# Patient Record
Sex: Male | Born: 1960
Health system: Southern US, Community
[De-identification: ages and names within clinical notes are randomized; demographics above are authoritative.]

## PROBLEM LIST (undated history)

## (undated) DIAGNOSIS — I519 Heart disease, unspecified: Secondary | ICD-10-CM

## (undated) DIAGNOSIS — E785 Hyperlipidemia, unspecified: Secondary | ICD-10-CM

## (undated) DIAGNOSIS — I5189 Other ill-defined heart diseases: Secondary | ICD-10-CM

## (undated) HISTORY — PX: DENTAL SURGERY: SHX609

## (undated) HISTORY — DX: Hyperlipidemia, unspecified: E78.5

## (undated) HISTORY — DX: Other ill-defined heart diseases: I51.89

---
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
## 2004-01-25 ENCOUNTER — Emergency Department (HOSPITAL_COMMUNITY): Admission: EM | Admit: 2004-01-25 | Discharge: 2004-01-25 | Payer: Self-pay | Admitting: Emergency Medicine

## 2006-04-17 ENCOUNTER — Emergency Department (HOSPITAL_COMMUNITY): Admission: EM | Admit: 2006-04-17 | Discharge: 2006-04-17 | Payer: Self-pay | Admitting: *Deleted

## 2006-05-27 ENCOUNTER — Emergency Department (HOSPITAL_COMMUNITY): Admission: EM | Admit: 2006-05-27 | Discharge: 2006-05-27 | Payer: Self-pay | Admitting: Emergency Medicine

## 2009-09-20 ENCOUNTER — Emergency Department (HOSPITAL_COMMUNITY): Admission: EM | Admit: 2009-09-20 | Discharge: 2009-09-20 | Payer: Self-pay | Admitting: Emergency Medicine

## 2010-11-09 LAB — CBC
HCT: 42.9 % (ref 39.0–52.0)
Hemoglobin: 14 g/dL (ref 13.0–17.0)
MCHC: 32.7 g/dL (ref 30.0–36.0)
MCV: 98.8 fL (ref 78.0–100.0)
Platelets: 149 K/uL — ABNORMAL LOW (ref 150–400)
RBC: 4.34 MIL/uL (ref 4.22–5.81)
RDW: 13.9 % (ref 11.5–15.5)
WBC: 8.4 K/uL (ref 4.0–10.5)

## 2010-11-09 LAB — DIFFERENTIAL
Basophils Relative: 1 % (ref 0–1)
Eosinophils Relative: 2 % (ref 0–5)
Lymphocytes Relative: 32 % (ref 12–46)
Monocytes Absolute: 0.6 10*3/uL (ref 0.1–1.0)
Neutrophils Relative %: 58 % (ref 43–77)

## 2010-11-09 LAB — HEMOCCULT GUIAC POC 1CARD (OFFICE): Fecal Occult Bld: POSITIVE

## 2016-07-11 ENCOUNTER — Emergency Department (HOSPITAL_COMMUNITY)
Admission: EM | Admit: 2016-07-11 | Discharge: 2016-07-11 | Disposition: A | Discharge status: Home / Self Care | Payer: Self-pay | Attending: Emergency Medicine | Admitting: Emergency Medicine

## 2016-07-11 ENCOUNTER — Emergency Department (HOSPITAL_COMMUNITY): Payer: Self-pay

## 2016-07-11 ENCOUNTER — Encounter (HOSPITAL_COMMUNITY): Payer: Self-pay

## 2016-07-11 DIAGNOSIS — Z791 Long term (current) use of non-steroidal anti-inflammatories (NSAID): Secondary | ICD-10-CM | POA: Insufficient documentation

## 2016-07-11 DIAGNOSIS — F17200 Nicotine dependence, unspecified, uncomplicated: Secondary | ICD-10-CM | POA: Insufficient documentation

## 2016-07-11 DIAGNOSIS — R059 Cough, unspecified: Secondary | ICD-10-CM

## 2016-07-11 DIAGNOSIS — J40 Bronchitis, not specified as acute or chronic: Secondary | ICD-10-CM | POA: Insufficient documentation

## 2016-07-11 DIAGNOSIS — R05 Cough: Secondary | ICD-10-CM

## 2016-07-11 MED ORDER — PREDNISONE 50 MG PO TABS: ORAL_TABLET | ORAL | 0 refills | Status: DC

## 2016-07-11 MED ORDER — ALBUTEROL SULFATE HFA 108 (90 BASE) MCG/ACT IN AERS: 2.0000 | INHALATION_SPRAY | Freq: Four times a day (QID) | RESPIRATORY_TRACT | 2 refills | Status: DC | PRN

## 2016-07-11 NOTE — ED Triage Notes (Signed)
Pt presents with c/o cough and body aches for the past couple of weeks. Pt reports the symptoms are worse at night when he is laying down. Pt denies any vomiting or diarrhea.

## 2016-07-11 NOTE — ED Provider Notes (Signed)
[facility]-EMERGENCY DEPT Provider Note   CSN: [id number] Arrival [id number] & time: 07/11/16  0931     History   Chief Complaint Chief Complaint  Patient presents with  . Cough  . Generalized Body Aches    HPI Nicholas Espinoza is a 55 y.o. male.  55 year old male presents with two-week history of nonoperative cough is worse in the evening. States he does use tobacco products daily. No fever or chills. No nausea vomiting. No recent medication changes. Denies any CHF or anginal type symptoms. Cough is worse when he tries to smoke cigarettes also. Has been using over-the-counter medications without relief. No prior history of same. Denies any history of COPD      History reviewed. No pertinent past medical history.  There are no active problems to display for this patient.   History reviewed. No pertinent surgical history.     Home Medications    Prior to Admission medications   Not on File    Family History No family history on file.  Social History Social History  Substance Use Topics  . Smoking status: Current Every Day Smoker  . Smokeless tobacco: Never Used  . Alcohol use No     Allergies   Patient has no known allergies.   Review of Systems Review of Systems  All other systems reviewed and are negative.    Physical Exam Updated Vital Signs BP 120/86 (BP Location: Left Arm)   Pulse 97   Temp 98 F (36.7 C) (Oral)   Resp 18   SpO2 100%   Physical Exam  Constitutional: He is oriented to person, place, and time. He appears well-developed and well-nourished.  Non-toxic appearance. No distress.  HENT:  Head: Normocephalic and atraumatic.  Eyes: Conjunctivae, EOM and lids are normal. Pupils are equal, round, and reactive to light.  Neck: Normal range of motion. Neck supple. No tracheal deviation present. No thyroid mass present.  Cardiovascular: Normal rate, regular rhythm and normal heart sounds.  Exam reveals no gallop.   No murmur  heard. Pulmonary/Chest: Effort normal. No stridor. No respiratory distress. He has decreased breath sounds. He has no wheezes. He has no rhonchi. He has no rales.  Abdominal: Soft. Normal appearance and bowel sounds are normal. He exhibits no distension. There is no tenderness. There is no rebound and no CVA tenderness.  Musculoskeletal: Normal range of motion. He exhibits no edema or tenderness.  Neurological: He is alert and oriented to person, place, and time. He has normal strength. No cranial nerve deficit or sensory deficit. GCS eye subscore is 4. GCS verbal subscore is 5. GCS motor subscore is 6.  Skin: Skin is warm and dry. No abrasion and no rash noted.  Psychiatric: He has a normal mood and affect. His speech is normal and behavior is normal.  Nursing note and vitals reviewed.    ED Treatments / Results  Labs (all labs ordered are listed, but only abnormal results are displayed) Labs Reviewed - No data to display  EKG  EKG Interpretation None       Radiology No results found.  Procedures Procedures (including critical care time)  Medications Ordered in ED Medications - No data to display   Initial Impression / Assessment and Plan / ED Course  I have reviewed the triage vital signs and the nursing notes.  Pertinent labs & imaging results that were available during my care of the patient were reviewed by me and considered in my medical decision making (see chart for  details).  Clinical Course     Chest x-ray without acute findings. Suspect that he has bronchitis. Will treat with steroids and albuterol inhaler  Final Clinical Impressions(s) / ED Diagnoses   Final diagnoses:  None    New Prescriptions New Prescriptions   No medications on file     Lorre NickAnthony Lanina Larranaga, MD 07/11/16 1115

## 2016-07-11 NOTE — ED Notes (Signed)
patietn returned from xray

## 2017-09-23 ENCOUNTER — Emergency Department (HOSPITAL_COMMUNITY): Payer: Self-pay

## 2017-09-23 ENCOUNTER — Emergency Department (HOSPITAL_COMMUNITY)
Admission: EM | Admit: 2017-09-23 | Discharge: 2017-09-23 | Disposition: A | Discharge status: Home / Self Care | Payer: Self-pay | Attending: Emergency Medicine | Admitting: Emergency Medicine

## 2017-09-23 ENCOUNTER — Other Ambulatory Visit: Payer: Self-pay

## 2017-09-23 ENCOUNTER — Encounter (HOSPITAL_COMMUNITY): Payer: Self-pay | Admitting: Emergency Medicine

## 2017-09-23 DIAGNOSIS — Z79899 Other long term (current) drug therapy: Secondary | ICD-10-CM | POA: Insufficient documentation

## 2017-09-23 DIAGNOSIS — F17200 Nicotine dependence, unspecified, uncomplicated: Secondary | ICD-10-CM | POA: Insufficient documentation

## 2017-09-23 DIAGNOSIS — M545 Low back pain, unspecified: Secondary | ICD-10-CM

## 2017-09-23 DIAGNOSIS — M25512 Pain in left shoulder: Secondary | ICD-10-CM | POA: Insufficient documentation

## 2017-09-23 MED ORDER — IBUPROFEN 400 MG PO TABS
600.0000 mg | ORAL_TABLET | Freq: Once | ORAL | Status: AC
  Administered 2017-09-23: 600 mg via ORAL
  Filled 2017-09-23: qty 1

## 2017-09-23 MED ORDER — IBUPROFEN 600 MG PO TABS: 600.0000 mg | ORAL_TABLET | Freq: Four times a day (QID) | ORAL | 0 refills | Status: DC | PRN

## 2017-09-23 MED ORDER — METHOCARBAMOL 500 MG PO TABS: 500.0000 mg | ORAL_TABLET | Freq: Every evening | ORAL | 0 refills | Status: DC | PRN

## 2017-09-23 NOTE — ED Notes (Signed)
Declined W/C at D/C and was escorted to lobby by RN. 
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
## 2017-09-23 NOTE — ED Notes (Signed)
Patient transported to X-ray 

## 2017-09-23 NOTE — Discharge Instructions (Signed)
Take Ibuprofen three times daily for the next week. Take this medicine with food. °Take muscle relaxer at bedtime to help you sleep. This medicine makes you drowsy so do not take before driving or work °Use a heating pad for sore muscles - use for 20 minutes several times a day °Return for worsening symptoms ° °
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
## 2020-01-15 ENCOUNTER — Encounter (HOSPITAL_COMMUNITY): Payer: Self-pay | Admitting: Emergency Medicine

## 2020-01-15 ENCOUNTER — Emergency Department (HOSPITAL_COMMUNITY): Payer: Self-pay

## 2020-01-15 ENCOUNTER — Emergency Department (HOSPITAL_COMMUNITY)
Admission: EM | Admit: 2020-01-15 | Discharge: 2020-01-15 | Disposition: A | Discharge status: Home / Self Care | Payer: Self-pay | Attending: Emergency Medicine | Admitting: Emergency Medicine

## 2020-01-15 ENCOUNTER — Other Ambulatory Visit: Payer: Self-pay

## 2020-01-15 DIAGNOSIS — R0602 Shortness of breath: Secondary | ICD-10-CM

## 2020-01-15 DIAGNOSIS — Z20822 Contact with and (suspected) exposure to covid-19: Secondary | ICD-10-CM | POA: Insufficient documentation

## 2020-01-15 DIAGNOSIS — R059 Cough, unspecified: Secondary | ICD-10-CM

## 2020-01-15 DIAGNOSIS — M7918 Myalgia, other site: Secondary | ICD-10-CM | POA: Insufficient documentation

## 2020-01-15 DIAGNOSIS — R0981 Nasal congestion: Secondary | ICD-10-CM | POA: Insufficient documentation

## 2020-01-15 DIAGNOSIS — R05 Cough: Secondary | ICD-10-CM | POA: Insufficient documentation

## 2020-01-15 DIAGNOSIS — R52 Pain, unspecified: Secondary | ICD-10-CM

## 2020-01-15 DIAGNOSIS — F17210 Nicotine dependence, cigarettes, uncomplicated: Secondary | ICD-10-CM | POA: Insufficient documentation

## 2020-01-15 LAB — TROPONIN I (HIGH SENSITIVITY)
Troponin I (High Sensitivity): 3 ng/L (ref <18)
Troponin I (High Sensitivity): 3 ng/L (ref <18)

## 2020-01-15 LAB — CBC
HCT: 37.6 % — ABNORMAL LOW (ref 39.0–52.0)
Hemoglobin: 12.9 g/dL — ABNORMAL LOW (ref 13.0–17.0)
MCH: 32.8 pg (ref 26.0–34.0)
MCHC: 34.3 g/dL (ref 30.0–36.0)
MCV: 95.7 fL (ref 80.0–100.0)
Platelets: 140 10*3/uL — ABNORMAL LOW (ref 150–400)
RBC: 3.93 MIL/uL — ABNORMAL LOW (ref 4.22–5.81)
RDW: 13.2 % (ref 11.5–15.5)
WBC: 12.2 10*3/uL — ABNORMAL HIGH (ref 4.0–10.5)
nRBC: 0 % (ref 0.0–0.2)

## 2020-01-15 LAB — BASIC METABOLIC PANEL
Anion gap: 11 (ref 5–15)
BUN: 16 mg/dL (ref 6–20)
CO2: 20 mmol/L — ABNORMAL LOW (ref 22–32)
Calcium: 8.7 mg/dL — ABNORMAL LOW (ref 8.9–10.3)
Chloride: 104 mmol/L (ref 98–111)
Creatinine, Ser: 0.89 mg/dL (ref 0.61–1.24)
GFR calc Af Amer: >60 mL/min (ref >60)
GFR calc non Af Amer: >60 mL/min (ref >60)
Glucose, Bld: 127 mg/dL — ABNORMAL HIGH (ref 70–99)
Potassium: 3.4 mmol/L — ABNORMAL LOW (ref 3.5–5.1)
Sodium: 135 mmol/L (ref 135–145)

## 2020-01-15 LAB — BRAIN NATRIURETIC PEPTIDE: B Natriuretic Peptide: 35.3 pg/mL (ref 0.0–100.0)

## 2020-01-15 LAB — SARS CORONAVIRUS 2 BY RT PCR (HOSPITAL ORDER, PERFORMED IN ~~LOC~~ HOSPITAL LAB): SARS Coronavirus 2: NEGATIVE

## 2020-01-15 MED ORDER — BENZONATATE 100 MG PO CAPS
100.0000 mg | ORAL_CAPSULE | Freq: Three times a day (TID) | ORAL | 0 refills | Status: DC
Start: 2020-01-15 — End: 2020-02-28

## 2020-01-15 MED ORDER — POTASSIUM CHLORIDE CRYS ER 20 MEQ PO TBCR
20.0000 meq | EXTENDED_RELEASE_TABLET | Freq: Two times a day (BID) | ORAL | 0 refills | Status: DC
Start: 2020-01-15 — End: 2020-02-28

## 2020-01-15 MED ORDER — SODIUM CHLORIDE 0.9% FLUSH: 3.0000 mL | Freq: Once | INTRAVENOUS | Status: DC

## 2020-01-15 MED ORDER — FLUTICASONE PROPIONATE 50 MCG/ACT NA SUSP
2.0000 | Freq: Every day | NASAL | 2 refills | Status: DC
Start: 2020-01-15 — End: 2020-02-28

## 2020-01-15 MED ORDER — PREDNISONE 10 MG (21) PO TBPK
ORAL_TABLET | ORAL | 0 refills | Status: DC
Start: 2020-01-15 — End: 2020-01-24

## 2020-01-15 MED ORDER — POTASSIUM CHLORIDE CRYS ER 20 MEQ PO TBCR
20.0000 meq | EXTENDED_RELEASE_TABLET | Freq: Once | ORAL | Status: AC
  Administered 2020-01-15: 20 meq via ORAL
  Filled 2020-01-15: qty 1

## 2020-01-15 NOTE — ED Triage Notes (Signed)
Pt in w/sob, chills and malaise since Saturday morning. States no known sick contacts. Feels congested, says his legs are sore, and he just feels generalized weakness.

## 2020-01-15 NOTE — ED Provider Notes (Signed)
[facility] EMERGENCY DEPARTMENT Provider Note   CSN: [id number] Arrival date & time: 01/15/20  3419     History Chief Complaint  Patient presents with  . Shortness of Breath    Nicholas Espinoza is a 59 y.o. male.  HPI      Nicholas Espinoza is a 59 y.o. male, patient with no diagnosed past medical history, presenting to the ED with shortness of breath beginning about two days ago. Accompanied by intermittently productive cough, nasal congestion, sinus pressure, subjective fever, chills, and body aches.   Has not had known COVID infection. No COVID vaccination.   Denies documented fever, N/V/D, chest pain, abdominal pain, dizziness, syncope, extremity edema, or any other complaints.      History reviewed. No pertinent past medical history.  There are no problems to display for this patient.   History reviewed. No pertinent surgical history.     No family history on file.  Social History   Tobacco Use  . Smoking status: Current Every Day Smoker  . Smokeless tobacco: Never Used  Substance Use Topics  . Alcohol use: No  . Drug use: No    Home Medications Prior to Admission medications   Medication Sig Start Date End Date Taking? Authorizing Provider  ibuprofen (ADVIL,MOTRIN) 200 MG tablet Take 200 mg by mouth daily as needed for moderate pain (toothaches).    Yes [provider]  albuterol (PROVENTIL HFA;VENTOLIN HFA) 108 (90 Base) MCG/ACT inhaler Inhale 2 puffs into the lungs every 6 (six) hours as needed for wheezing or shortness of breath. 07/11/16 01/15/20 Yes Lorre Nick, MD  benzonatate (TESSALON) 100 MG capsule Take 1 capsule (100 mg total) by mouth every 8 (eight) hours. 01/15/20   Aireal Slater C, PA-C  fluticasone (FLONASE) 50 MCG/ACT nasal spray Place 2 sprays into both nostrils daily. 01/15/20   Cecia Egge C, PA-C  potassium chloride SA (KLOR-CON) 20 MEQ tablet Take 1 tablet (20 mEq total) by mouth 2 (two) times daily for 5 days.  01/15/20 01/20/20  Manpreet Kemmer C, PA-C  predniSONE (STERAPRED UNI-PAK 21 TAB) 10 MG (21) TBPK tablet Take 6 tabs (60mg ) day 1, 5 tabs (50mg ) day 2, 4 tabs (40mg ) day 3, 3 tabs (30mg ) day 4, 2 tabs (20mg ) day 5, and 1 tab (10mg ) day 6. 01/15/20   Otilia Kareem C, PA-C    Allergies    Patient has no known allergies.  Review of Systems   Review of Systems  Constitutional: Positive for chills and fever.  HENT: Positive for congestion and sinus pressure. Negative for sore throat, trouble swallowing and voice change.   Respiratory: Positive for cough and shortness of breath (occasional).   Cardiovascular: Negative for chest pain and leg swelling.  Gastrointestinal: Negative for abdominal pain, diarrhea, nausea and vomiting.  Musculoskeletal: Positive for myalgias.  Neurological: Negative for dizziness and syncope.  All other systems reviewed and are negative.   Physical Exam Updated Vital Signs BP 122/71   Pulse 95   Temp 99.3 F (37.4 C) (Oral)   Resp 20   Wt 61.2 kg   SpO2 99%   BMI 21.78 kg/m   Physical Exam Vitals and nursing note reviewed.  Constitutional:      General: He is not in acute distress.    Appearance: He is well-developed. He is not diaphoretic.  HENT:     Head: Normocephalic and atraumatic.     Nose: Mucosal edema and congestion present.     Right Sinus:  No maxillary sinus tenderness or frontal sinus tenderness.     Left Sinus: No maxillary sinus tenderness or frontal sinus tenderness.     Mouth/Throat:     Mouth: Mucous membranes are moist.     Pharynx: Oropharynx is clear.  Eyes:     Conjunctiva/sclera: Conjunctivae normal.  Cardiovascular:     Rate and Rhythm: Normal rate and regular rhythm.     Pulses: Normal pulses.          Radial pulses are 2+ on the right side and 2+ on the left side.       Posterior tibial pulses are 2+ on the right side and 2+ on the left side.     Heart sounds: Normal heart sounds.     Comments: Tactile temperature in the  extremities appropriate and equal bilaterally. Pulmonary:     Effort: Pulmonary effort is normal. No respiratory distress.     Breath sounds: Normal breath sounds.     Comments: No increased work of breathing.  Speaks in full sentences without difficulty. Abdominal:     Palpations: Abdomen is soft.     Tenderness: There is no abdominal tenderness. There is no guarding.  Musculoskeletal:     Cervical back: Neck supple.     Right lower leg: No edema.     Left lower leg: No edema.  Lymphadenopathy:     Cervical: No cervical adenopathy.  Skin:    General: Skin is warm and dry.  Neurological:     Mental Status: He is alert.  Psychiatric:        Mood and Affect: Mood and affect normal.        Speech: Speech normal.        Behavior: Behavior normal.     ED Results / Procedures / Treatments   Labs (all labs ordered are listed, but only abnormal results are displayed) Labs Reviewed  BASIC METABOLIC PANEL - Abnormal; Notable for the following components:      Result Value   Potassium 3.4 (*)    CO2 20 (*)    Glucose, Bld 127 (*)    Calcium 8.7 (*)    All other components within normal limits  CBC - Abnormal; Notable for the following components:   WBC 12.2 (*)    RBC 3.93 (*)    Hemoglobin 12.9 (*)    HCT 37.6 (*)    Platelets 140 (*)    All other components within normal limits  SARS CORONAVIRUS 2 BY RT PCR (HOSPITAL ORDER, [place] [facility])  BRAIN NATRIURETIC PEPTIDE  TROPONIN I (HIGH SENSITIVITY)  TROPONIN I (HIGH SENSITIVITY)    Hemoglobin  Date Value Ref Range Status  01/15/2020 12.9 (L) 13.0 - 17.0 g/dL Final  09/20/2009 14.0 13.0 - 17.0 g/dL Final    EKG EKG Interpretation  Date/Time:  Monday Jan 15 2020 06:37:09 EDT Ventricular Rate:  97 PR Interval:  142 QRS Duration: 76 QT Interval:  338 QTC Calculation: 429 R Axis:   -62 Text Interpretation:  Critical Test Result: STEMI Normal sinus rhythm Biatrial enlargement Left axis deviation  Pulmonary disease pattern ST elevation consider anterior injury or acute infarct Abnormal ECG No previous ECGs available Confirmed by Theotis Burrow [phone]) on 01/15/2020 8:11:26 AM   EKG Interpretation  Date/Time:  Monday Jan 15 2020 08:27:11 EDT Ventricular Rate:  77 PR Interval:  142 QRS Duration: 99 QT Interval:  360 QTC Calculation: 408 R Axis:   -51 Text Interpretation: Sinus rhythm  Consider left atrial enlargement LAD, consider left anterior fascicular block Abnormal R-wave progression, early transition Nonspecific T abnrm, anterolateral leads Borderline ST elevation, anterolateral leads saddle pattern in V3 raises concern for brugada Confirmed by Frederick Peers [phone]) on 01/15/2020 3:54:15 PM       Radiology DG Chest Portable 1 View  Result Date: 01/15/2020 CLINICAL DATA:  Cough and shortness of breath.  Congestion. EXAM: PORTABLE CHEST 1 VIEW COMPARISON:  07/11/2016 FINDINGS: Grossly unchanged cardiac silhouette and mediastinal contours with mild nodularity of the bilateral hila, similar to the 2017 examination, presumably secondary to mild prominence of the central pulmonary vasculature. The lungs appear hyperexpanded with flattening of the diaphragms. No discrete focal airspace opacities. No pleural effusion, though note, the bilateral costophrenic angles are excluded from view. No pneumothorax. No evidence of edema. No acute osseous abnormalities. IMPRESSION: Hyperexpanded lungs without superimposed acute cardiopulmonary disease. Specifically, no discrete focal airspace opacities to suggest pneumonia. Electronically Signed   By: Simonne Come M.D.   On: 01/15/2020 07:47    Procedures Procedures (including critical care time)  Medications Ordered in ED Medications  sodium chloride flush (NS) 0.9 % injection 3 mL (3 mLs Intravenous Not Given 01/15/20 1136)  potassium chloride SA (KLOR-CON) CR tablet 20 mEq (20 mEq Oral Given 01/15/20 1135)    ED Course  I have reviewed the triage  vital signs and the nursing notes.  Pertinent labs & imaging results that were available during my care of the patient were reviewed by me and considered in my medical decision making (see chart for details).  Clinical Course as of Jan 16 1308  Mon Jan 15, 2020  0093 Spoke with Dr. Tresa Endo, cardiologist on call for STEMI. We discussed the patient's symptoms. He reviewed the EKG. States he has a RBBB, but V3 is the concern.  Recommends repeat EKG and troponins. Call back if there are changes noted.  Patient denies known family history of SCD.  ED EKG [[facility]J]  [phone] Patient continues to deny chest pain, diaphoresis, dizziness.  No shortness of breath since ED arrival.   [[facility]J]  1055 Spoke with Dr. Cristal Deer, Cardiology.  Reviewed patient's symptoms and discussed EKG.  The key here is that the patient has not had syncope, which would be a red flag. Patient does deny syncope or near syncope.  We can get BNP. If negative, it rules out cardiac as cause for patient's shortness of breath. If positive, it does not have much value right now.  Recommends close follow up in the office.  He will also need to have an echocardiogram performed, ideally before he is seen in the office. Supplement patient's hypokalemia.   [SJ]  1223 Spoke with Rosann Auerbach, Programmer, multimedia. States she will take care of setting up Echo for patient as an outpatient.   [SJ]    Clinical Course User Index [[facility]J] Kirk Basquez C, PA-C   MDM Rules/Calculators/A&P                      Patient presents with intermittent shortness of breath along with cough, body aches, and nasal congestion for several days. Patient is nontoxic appearing, afebrile, not tachycardic, not tachypneic, not hypotensive, maintains excellent SPO2 on room air, and is in no apparent distress.   I have reviewed the patient's chart to obtain more information.   I reviewed and interpreted the patient's labs and radiological studies. Chest x-ray without evidence of acute  abnormality. Mild hypokalemia noted and addressed. Mild leukocytosis. Delta troponins  negative.  BNP negative. EKG with abnormality, especially in V3.  Question of Brugada.  No significant history.  No chest pain.  No symptoms to suggest episodes of VT.  Patient to have close follow-up with cardiology.  Patient will also have outpatient echo.  Alternative chest pathology, such as PE, was considered.  No tachycardia, no known risk factors, no signs of DVT.  The patient was given instructions for home care as well as return precautions. Patient voices understanding of these instructions, accepts the plan, and is comfortable with discharge.   Findings and plan of care discussed with Frederick Peers, MD.   Vitals:   01/15/20 0915 01/15/20 0930 01/15/20 0945 01/15/20 1317  BP: 113/85 115/83  126/84  Pulse: 72   71  Resp: 18 19 20  (!) 25  Temp:    98.7 F (37.1 C)  TempSrc:    Oral  SpO2: 98%   99%  Weight:         Final Clinical Impression(s) / ED Diagnoses Final diagnoses:  Cough  Body aches  SOB (shortness of breath)    Rx / DC Orders ED Discharge Orders         Ordered    Ambulatory referral to Cardiology    Comments: Close follow up per Dr. .   01/15/20 1252    predniSONE (STERAPRED UNI-PAK 21 TAB) 10 MG (21) TBPK tablet     01/15/20 1253    potassium chloride SA (KLOR-CON) 20 MEQ tablet  2 times daily     01/15/20 1253    benzonatate (TESSALON) 100 MG capsule  Every 8 hours     01/15/20 1253    fluticasone (FLONASE) 50 MCG/ACT nasal spray  Daily     01/15/20 1253           01/17/20, PA-C 01/16/20 1314    Little, 01/18/20, MD 01/16/20 1416

## 2020-01-15 NOTE — ED Notes (Signed)
Pt given Sprite per Little, MD.

## 2020-01-15 NOTE — ED Notes (Signed)
EKG given to Dr. Eudelia Bunch, do not activate STEMI at this time per him. Will be next roomed though

## 2020-01-15 NOTE — Discharge Instructions (Addendum)
We recommend following up with cardiology as soon as possible.  Call the office to make an appointment.  They should also be in contact with you to set up an ultrasound of the heart.  This is a very important step in the process.  Your potassium was slightly lower than normal.  Take the potassium supplement, as prescribed, until finished. This value should be rechecked in the next week or two.  Please return to the emergency department or call 911 should you begin to have chest pain, worsening shortness of breath, passing out, or any other major concerns.  Below you will find instructions for addressing symptoms associated with what is likely a viral illness.  General Viral Syndrome Care Instructions:  Your symptoms are likely consistent with a viral illness. Viruses do not require or respond to antibiotics. Treatment is symptomatic care and it is important to note that these symptoms may last for 7-14 days.   Hand washing: Wash your hands throughout the day, but especially before and after touching the face, using the restroom, sneezing, coughing, or touching surfaces that have been coughed or sneezed upon. Hydration: Symptoms of most illnesses will be intensified and complicated by dehydration. Dehydration can also extend the duration of symptoms. Drink plenty of fluids and get plenty of rest. You should be drinking at least half a liter of water an hour to stay hydrated. Electrolyte drinks (ex. Gatorade, Powerade, Pedialyte) are also encouraged. You should be drinking enough fluids to make your urine light yellow, almost clear. If this is not the case, you are not drinking enough water. Please note that some of the treatments indicated below will not be effective if you are not adequately hydrated. Diet: Please concentrate on hydration, however, you may introduce food slowly.  Start with a clear liquid diet, progressed to a full liquid diet, and then bland solids as you are able. Pain or fever:  Ibuprofen, Naproxen, or acetaminophen (generic for Tylenol) for pain or fever.  Antiinflammatory medications: Take 600 mg of ibuprofen every 6 hours or 440 mg (over the counter dose) to 500 mg (prescription dose) of naproxen every 12 hours for the next 3 days. After this time, these medications may be used as needed for pain. Take these medications with food to avoid upset stomach. Choose only one of these medications, do not take them together. Acetaminophen (generic for Tylenol): Should you continue to have additional pain while taking the ibuprofen or naproxen, you may add in acetaminophen as needed. Your daily total maximum amount of acetaminophen from all sources should be limited to 4000mg /day for persons without liver problems, or 2000mg /day for those with liver problems. Cough: Use the benzonatate (generic for Tessalon) for cough.  Teas, warm liquids, broths, and honey can also help with cough. Prednisone: Take the prednisone, as directed, in its entirety. Zyrtec or Claritin: May add these medication daily to control underlying symptoms of congestion, sneezing, and other signs of allergies.  These medications are available over-the-counter. Generics: Cetirizine (generic for Zyrtec) and loratadine (generic for Claritin). Fluticasone: Use fluticasone (generic for Flonase), as directed, for nasal and sinus congestion.  This medication is available over-the-counter. Congestion: Plain guaifenesin (generic for plain Mucinex) may help relieve congestion. Saline sinus rinses and saline nasal sprays may also help relieve congestion.  Sore throat: Warm liquids or Chloraseptic spray may help soothe a sore throat. Gargle twice a day with a salt water solution made from a half teaspoon of salt in a cup of warm water.  Follow up: Follow up with a primary care provider within the next two weeks should symptoms fail to resolve. Return: Return to the ED for significantly worsening symptoms, shortness of breath,  persistent vomiting, large amounts of blood in stool, or any other major concerns.  For prescription assistance, may try using prescription discount sites or apps, such as goodrx.com

## 2020-01-16 ENCOUNTER — Telehealth (HOSPITAL_COMMUNITY): Payer: Self-pay

## 2020-01-22 NOTE — Progress Notes (Signed)
Patient ID: Nicholas Espinoza, male   DOB: 01-28-1961, 59 y.o.   MRN: [id number]     Aseem Sessums, is a 59 y.o. male  [id number]:[id number]  [id number]  DOB - [birth year]  Subjective:  Chief Complaint and HPI: Nicholas Espinoza is a 59 y.o. male here today to establish care and for a follow up visit After ED visit 01/15/2020 for URI s/sx but found to have abnormal ekg.  It is recommended that Echo be scheduled as outpatient, but it has not been scheduled.  He denies any CP or SOB.  He says his legs have been getting tired more often than usual the last few months but denies swelling.  Smokes about 1/2ppd.  Patient was advised to f/up with cardiology.  No appt has been made.  He is feeling better overall than he was at the ED.  No FH sudden death.  Finished prednisone and potassium.  From A/P: Patient presents with intermittent shortness of breath along with cough, body aches, and nasal congestion for several days. Patient is nontoxic appearing, afebrile, not tachycardic, not tachypneic, not hypotensive, maintains excellent SPO2 on room air, and is in no apparent distress.   I have reviewed the patient's chart to obtain more information.   I reviewed and interpreted the patient's labs and radiological studies. Chest x-ray without evidence of acute abnormality. Mild hypokalemia noted and addressed. Mild leukocytosis. Delta troponins negative.  BNP negative. EKG with abnormality, especially in V3.  Question of Brugada.  No significant history.  No chest pain.  No symptoms to suggest episodes of VT.  Patient to have close follow-up with cardiology.  Patient will also have outpatient echo.  Alternative chest pathology, such as PE, was considered.  No tachycardia, no known risk factors, no signs of DVT.  The patient was given instructions for home care as well as return precautions. Patient voices understanding of these instructions, accepts the plan, and is comfortable with discharge.  ED/Hospital  notes reviewed.    ROS:   Constitutional:  No f/c, No night sweats, No unexplained weight loss. EENT:  No vision changes, No blurry vision, No hearing changes. No mouth, throat, or ear problems.  Respiratory: No cough, No SOB Cardiac: No CP, no palpitations GI:  No abd pain, No N/V/D. GU: No Urinary s/sx Musculoskeletal: No joint pain Neuro: No headache, no dizziness, no motor weakness.  Skin: No rash Endocrine:  No polydipsia. No polyuria.  Psych: Denies SI/HI  No problems updated.  ALLERGIES: No Known Allergies  PAST MEDICAL HISTORY: History reviewed. No pertinent past medical history.  MEDICATIONS AT HOME: Prior to Admission medications   Medication Sig Start Date End Date Taking? Authorizing Provider  benzonatate (TESSALON) 100 MG capsule Take 1 capsule (100 mg total) by mouth every 8 (eight) hours. Patient not taking: Reported on 01/24/2020 01/15/20   Joy, Ines Bloomer C, PA-C  fluticasone (FLONASE) 50 MCG/ACT nasal spray Place 2 sprays into both nostrils daily. Patient not taking: Reported on 01/24/2020 01/15/20   Anselm Pancoast, PA-C  ibuprofen (ADVIL,MOTRIN) 200 MG tablet Take 200 mg by mouth daily as needed for moderate pain (toothaches).     [provider]  potassium chloride SA (KLOR-CON) 20 MEQ tablet Take 1 tablet (20 mEq total) by mouth 2 (two) times daily for 5 days. 01/15/20 01/20/20  Joy, Shawn C, PA-C  albuterol (PROVENTIL HFA;VENTOLIN HFA) 108 (90 Base) MCG/ACT inhaler Inhale 2 puffs into the lungs every 6 (six) hours as needed for wheezing or shortness of breath.  07/11/16 01/15/20  Lacretia Leigh, MD     Objective:  EXAM:   Vitals:   01/24/20 1052  BP: 100/64  Pulse: 92  SpO2: 99%  Weight: 126 lb (57.2 kg)  Height: 5\' 5"  (1.651 m)    General appearance : A&OX3. NAD. Non-toxic-appearing HEENT: Atraumatic and Normocephalic.  PERRLA. EOM intact.  Neck: supple, no JVD. No cervical lymphadenopathy. No thyromegaly Chest/Lungs:  Breathing-non-labored, Good air  entry bilaterally, breath sounds normal without rales, rhonchi, or wheezing  CVS: S1 S2 regular, no murmurs, gallops, rubs  Extremities: Bilateral Lower Ext shows no edema, both legs are warm to touch with = pulse throughout Neurology:  CN II-XII grossly intact, Non focal.   Psych:  TP linear. J/I WNL. Normal speech. Appropriate eye contact and affect.  Skin:  No Rash  Data Review No results found for: HGBA1C   Assessment & Plan   1. Brugada syndrome-not ruled out; suspicions in ED - ECHOCARDIOGRAM COMPLETE; Future - Ambulatory referral to Cardiology -CP warning reviewed extensively.  patient to call 911 and he verbalized understanding  2. Hypokalemia - Basic metabolic panel  3. Hospital discharge follow-up Drink 80-100 ounces water daily.  If your labs are normal, I will have you start taking 81mg  baby aspirin daily for heart protective benefit.  Avoid drinking alcohol.  Work on stopping smoking  4. Hyperglycemia I have had a lengthy discussion and provided education about insulin resistance and the intake of too much sugar/refined carbohydrates.  I have advised the patient to work at a goal of eliminating sugary drinks, candy, desserts, sweets, refined sugars, processed foods, and white carbohydrates.  The patient expresses understanding.  - Hemoglobin A1c  5. Thrombocytopenia ([place]) - CBC with Differential/Platelet   Patient have been counseled extensively about nutrition and exercise  Return in about 1 month (around 02/23/2020) for assign PCP.  The patient was given clear instructions to go to ER or return to medical center if symptoms don't improve, worsen or new problems develop. The patient verbalized understanding. The patient was told to call to get lab results if they haven't heard anything in the next week.     Freeman Caldron, PA-C [facility] [place], [place]   01/24/2020, 11:15 AM

## 2020-01-24 ENCOUNTER — Ambulatory Visit: Payer: Self-pay | Attending: Family Medicine | Admitting: Physician Assistant

## 2020-01-24 ENCOUNTER — Other Ambulatory Visit: Payer: Self-pay

## 2020-01-24 VITALS — BP 100/64 | HR 92 | Ht 65.0 in | Wt 126.0 lb

## 2020-01-24 DIAGNOSIS — Z09 Encounter for follow-up examination after completed treatment for conditions other than malignant neoplasm: Secondary | ICD-10-CM

## 2020-01-24 DIAGNOSIS — E876 Hypokalemia: Secondary | ICD-10-CM

## 2020-01-24 DIAGNOSIS — I498 Other specified cardiac arrhythmias: Secondary | ICD-10-CM

## 2020-01-24 DIAGNOSIS — D696 Thrombocytopenia, unspecified: Secondary | ICD-10-CM

## 2020-01-24 DIAGNOSIS — R739 Hyperglycemia, unspecified: Secondary | ICD-10-CM

## 2020-01-24 NOTE — Patient Instructions (Signed)
Drink 80-100 ounces water daily.  If your labs are normal, I will have you start taking 81mg  baby aspirin daily for heart protective benefit.  Avoid drinking alcohol.  Work on stopping smoking   Nonspecific Chest Pain Chest pain can be caused by many different conditions. Some causes of chest pain can be life-threatening. These will require treatment right away. Serious causes of chest pain include:  Heart attack.  A tear in the body's main blood vessel.  Redness and swelling (inflammation) around your heart.  Blood clot in your lungs. Other causes of chest pain may not be so serious. These include:  Heartburn.  Anxiety or stress.  Damage to bones or muscles in your chest.  Lung infections. Chest pain can feel like:  Pain or discomfort in your chest.  Crushing, pressure, aching, or squeezing pain.  Burning or tingling.  Dull or sharp pain that is worse when you move, cough, or take a deep breath.  Pain or discomfort that is also felt in your back, neck, jaw, shoulder, or arm, or pain that spreads to any of these areas. It is hard to know whether your pain is caused by something that is serious or something that is not so serious. So it is important to see your doctor right away if you have chest pain. Follow these instructions at home: Medicines  Take over-the-counter and prescription medicines only as told by your doctor.  If you were prescribed an antibiotic medicine, take it as told by your doctor. Do not stop taking the antibiotic even if you start to feel better. Lifestyle   Rest as told by your doctor.  Do not use any products that contain nicotine or tobacco, such as cigarettes, e-cigarettes, and chewing tobacco. If you need help quitting, ask your doctor.  Do not drink alcohol.  Make lifestyle changes as told by your doctor. These may include: ? Getting regular exercise. Ask your doctor what activities are safe for you. ? Eating a heart-healthy diet. A diet  and nutrition specialist (dietitian) can help you to learn healthy eating options. ? Staying at a healthy weight. ? Treating diabetes or high blood pressure, if needed. ? Lowering your stress. Activities such as yoga and relaxation techniques can help. General instructions  Pay attention to any changes in your symptoms. Tell your doctor about them or any new symptoms.  Avoid any activities that cause chest pain.  Keep all follow-up visits as told by your doctor. This is important. You may need more testing if your chest pain does not go away. Contact a doctor if:  Your chest pain does not go away.  You feel depressed.  You have a fever. Get help right away if:  Your chest pain is worse.  You have a cough that gets worse, or you cough up blood.  You have very bad (severe) pain in your belly (abdomen).  You pass out (faint).  You have either of these for no clear reason: ? Sudden chest discomfort. ? Sudden discomfort in your arms, back, neck, or jaw.  You have shortness of breath at any time.  You suddenly start to sweat, or your skin gets clammy.  You feel sick to your stomach (nauseous).  You throw up (vomit).  You suddenly feel lightheaded or dizzy.  You feel very weak or tired.  Your heart starts to beat fast, or it feels like it is skipping beats. These symptoms may be an emergency. Do not wait to see if the symptoms  will go away. Get medical help right away. Call your local emergency services (911 in the [place] Do not drive yourself to the hospital. Summary  Chest pain can be caused by many different conditions. The cause may be serious and need treatment right away. If you have chest pain, see your doctor right away.  Follow your doctor's instructions for taking medicines and making lifestyle changes.  Keep all follow-up visits as told by your doctor. This includes visits for any further testing if your chest pain does not go away.  Be sure to know the signs  that show that your condition has become worse. Get help right away if you have these symptoms. This information is not intended to replace advice given to you by your health care provider. Make sure you discuss any questions you have with your health care provider. Document Revised: 02/10/2018 Document Reviewed: 02/10/2018 Elsevier Patient Education  2020 [employer].

## 2020-01-25 LAB — BASIC METABOLIC PANEL
BUN/Creatinine Ratio: 21 — ABNORMAL HIGH (ref 9–20)
BUN: 19 mg/dL (ref 6–24)
CO2: 25 mmol/L (ref 20–29)
Calcium: 9.3 mg/dL (ref 8.7–10.2)
Chloride: 105 mmol/L (ref 96–106)
Creatinine, Ser: 0.9 mg/dL (ref 0.76–1.27)
GFR calc Af Amer: 109 mL/min/{1.73_m2} (ref >59)
GFR calc non Af Amer: 94 mL/min/{1.73_m2} (ref >59)
Glucose: 86 mg/dL (ref 65–99)
Potassium: 4.9 mmol/L (ref 3.5–5.2)
Sodium: 142 mmol/L (ref 134–144)

## 2020-01-25 LAB — CBC WITH DIFFERENTIAL/PLATELET
Basophils Absolute: 0.1 10*3/uL (ref 0.0–0.2)
Basos: 0 %
EOS (ABSOLUTE): 0.1 10*3/uL (ref 0.0–0.4)
Eos: 1 %
Hematocrit: 39.3 % (ref 37.5–51.0)
Hemoglobin: 12.8 g/dL — ABNORMAL LOW (ref 13.0–17.7)
Immature Grans (Abs): 0 10*3/uL (ref 0.0–0.1)
Immature Granulocytes: 0 %
Lymphocytes Absolute: 3.6 10*3/uL — ABNORMAL HIGH (ref 0.7–3.1)
Lymphs: 27 %
MCH: 32 pg (ref 26.6–33.0)
MCHC: 32.6 g/dL (ref 31.5–35.7)
MCV: 98 fL — ABNORMAL HIGH (ref 79–97)
Monocytes Absolute: 1.3 10*3/uL — ABNORMAL HIGH (ref 0.1–0.9)
Monocytes: 9 %
Neutrophils Absolute: 8.5 10*3/uL — ABNORMAL HIGH (ref 1.4–7.0)
Neutrophils: 63 %
Platelets: 266 10*3/uL (ref 150–450)
RBC: 4 x10E6/uL — ABNORMAL LOW (ref 4.14–5.80)
RDW: 12.9 % (ref 11.6–15.4)
WBC: 13.5 10*3/uL — ABNORMAL HIGH (ref 3.4–10.8)

## 2020-01-25 LAB — HEMOGLOBIN A1C
Est. average glucose Bld gHb Est-mCnc: 108 mg/dL
Hgb A1c MFr Bld: 5.4 % (ref 4.8–5.6)

## 2020-01-30 ENCOUNTER — Other Ambulatory Visit: Payer: Self-pay

## 2020-01-30 ENCOUNTER — Ambulatory Visit (HOSPITAL_COMMUNITY)
Admission: RE | Admit: 2020-01-30 | Discharge: 2020-01-30 | Disposition: A | Discharge status: Home / Self Care | Payer: Self-pay | Source: Ambulatory Visit | Attending: Physician Assistant | Admitting: Physician Assistant

## 2020-01-30 DIAGNOSIS — I498 Other specified cardiac arrhythmias: Secondary | ICD-10-CM | POA: Insufficient documentation

## 2020-01-30 DIAGNOSIS — R9431 Abnormal electrocardiogram [ECG] [EKG]: Secondary | ICD-10-CM | POA: Insufficient documentation

## 2020-01-30 NOTE — Progress Notes (Signed)
  Echocardiogram 2D Echocardiogram has been performed.  Nicholas Espinoza G [name] 01/30/2020, 11:23 AM

## 2020-02-10 NOTE — Progress Notes (Signed)
Cardiology Office Note:   Date:  02/12/2020  NAME:  Nicholas Espinoza    MRN: [id number] DOB:  08-04-1961   PCP:  Patient, No Pcp Per  Cardiologist:  No primary care provider on file.   Referring MD: Argentina Donovan, PA-C   Chief Complaint  Patient presents with  . Abnormal ECG    History of Present Illness:   Nicholas Espinoza is a 59 y.o. male without history who is being seen today for the evaluation of abnormal EKG at the request of Argentina Donovan, [place]. Seen in the ER 5/24 for SOB. EKG concerning for type 1 Brugada pattern. No symptoms to suggest Brugada syndrome. Troponin negative and BNP negative. Echo normal.   He reports he is doing well today and has no diagnosed medical problems.  He is not seen a physician in years.  He apparently went to the emergency room on the Sunday he was seen there for pain in his legs as well as all over his body.  No identifiable etiology was found.  His EKG was concerning for possible Brugada and that he was ruled out for an acute coronary syndrome and sent to follow-up with [place].  He had an echocardiogram which shows normal LV function.  He reports that he is not had any syncope.  He states about 20 years ago he had a passing out spell but is had none since then.  He does report that when he walks he can get dizzy and lightheaded with blurry vision.  He reports he does feel that his heart can race when he does this as well.  He is not had any syncope but he reports that he stops walking and his symptoms resolved.  He reports no chest pain or shortness of breath.  Symptoms mainly appear to be dizziness, lightheadedness and blurry vision with exertion.  There is no strong family history of any channelopathy.  There is no sudden death reported.  He reports his brother had a heart attack when he got out of the [employer] and then was debilitated from this living in a nursing home.  He reports what I suspect is coronary artery disease in his mother.  He has had no  fevers.  He reports that he has 4 children and none of them have any heart conditions.  He reports his overall is new to him as he did not suspect he would have heart condition.  His EKG is rather interesting as it does have ST elevation in V1 through V2 with coving of the ST segments.  Past Medical History: History reviewed. No pertinent past medical history.  Past Surgical History: Past Surgical History:  Procedure Laterality Date  . DENTAL SURGERY      Current Medications: Current Meds  Medication Sig  . benzonatate (TESSALON) 100 MG capsule Take 1 capsule (100 mg total) by mouth every 8 (eight) hours.  . fluticasone (FLONASE) 50 MCG/ACT nasal spray Place 2 sprays into both nostrils daily.  Marland Kitchen ibuprofen (ADVIL,MOTRIN) 200 MG tablet Take 200 mg by mouth daily as needed for moderate pain (toothaches).      Allergies:    Patient has no known allergies.   Social History: Social History   Socioeconomic History  . Marital status: Single    Spouse name: Not on file  . Number of children: 4  . Years of education: Not on file  . Highest education level: Not on file  Occupational History  . Occupation: cook  Tobacco  Use  . Smoking status: Current Every Day Smoker    Packs/day: 25.00    Years: 0.50    Pack years: 12.50  . Smokeless tobacco: Never Used  Substance and Sexual Activity  . Alcohol use: No  . Drug use: Yes    Types: Marijuana  . Sexual activity: Not on file  Other Topics Concern  . Not on file  Social History Narrative  . Not on file   Social Determinants of Health   Financial Resource Strain:   . Difficulty of Paying Living Expenses:   Food Insecurity:   . Worried About Programme researcher, broadcasting/film/video in the Last Year:   . Barista in the Last Year:   Transportation Needs:   . Freight forwarder (Medical):   Marland Kitchen Lack of Transportation (Non-Medical):   Physical Activity:   . Days of Exercise per Week:   . Minutes of Exercise per Session:   Stress:   .  Feeling of Stress :   Social Connections:   . Frequency of Communication with Friends and Family:   . Frequency of Social Gatherings with Friends and Family:   . Attends Religious Services:   . Active Member of Clubs or Organizations:   . Attends Banker Meetings:   Marland Kitchen Marital Status:      Family History: The patient's family history includes Heart attack in his mother; Heart attack (age of onset: 55) in his brother.  ROS:   All other ROS reviewed and negative. Pertinent positives noted in the HPI.     EKGs/Labs/Other Studies Reviewed:   The following studies were personally reviewed by me today:  EKG:  EKG is ordered today.  The ekg ordered today demonstrates normal sinus rhythm, heart rate 85, ST elevation noted in lead V1 V2 which is coving, concerning for Brugada pattern, and was personally reviewed by me.   TTE 01/30/2020 1. Left ventricular ejection fraction, by estimation, is 65 to 70%. The  left ventricle has normal function. The left ventricle has no regional  wall motion abnormalities. Left ventricular diastolic parameters are  consistent with Grade I diastolic  dysfunction (impaired relaxation).  2. Right ventricular systolic function is normal. The right ventricular  size is normal.  3. The mitral valve is normal in structure. Trivial mitral valve  regurgitation. No evidence of mitral stenosis.  4. The aortic valve is normal in structure. Aortic valve regurgitation is  not visualized. No aortic stenosis is present.  5. The inferior vena cava is normal in size with greater than 50%  respiratory variability, suggesting right atrial pressure of 3 mmHg.   Recent Labs: 01/15/2020: B Natriuretic Peptide 35.3 01/24/2020: BUN 19; Creatinine, Ser 0.90; Hemoglobin 12.8; Platelets 266; Potassium 4.9; Sodium 142   Recent Lipid Panel No results found for: CHOL, TRIG, HDL, CHOLHDL, VLDL, LDLCALC, LDLDIRECT  Physical Exam:   VS:  BP 108/68   Pulse 85   Temp 98.1  F (36.7 C)   Ht 5\' 5"  (1.651 m)   Wt 126 lb (57.2 kg)   SpO2 99%   BMI 20.97 kg/m    Wt Readings from Last 3 Encounters:  02/12/20 126 lb (57.2 kg)  01/24/20 126 lb (57.2 kg)  01/15/20 134 lb 14.7 oz (61.2 kg)    General: Well nourished, well developed, in no acute distress Heart: Atraumatic, normal size  Eyes: PEERLA, EOMI  Neck: Supple, no JVD Endocrine: No thryomegaly Cardiac: Normal S1, S2; RRR; no murmurs, rubs, or gallops  Lungs: Clear to auscultation bilaterally, no wheezing, rhonchi or rales  Abd: Soft, nontender, no hepatomegaly  Ext: No edema, pulses 2+ Musculoskeletal: No deformities, BUE and BLE strength normal and equal Skin: Warm and dry, no rashes   Neuro: Alert and oriented to person, place, time, and situation, CNII-XII grossly intact, no focal deficits  Psych: Normal mood and affect   ASSESSMENT:   Nicholas Espinoza is a 59 y.o. male who presents for the following: 1. Brugada syndrome   2. Nonspecific abnormal electrocardiogram (ECG) (EKG)   3. SOB (shortness of breath)   4. Dizziness   5. Palpitations     PLAN:   1. Brugada syndrome 2. Nonspecific abnormal electrocardiogram (ECG) (EKG) 3. SOB (shortness of breath) 4. Dizziness 5. Palpitations -His EKG is concerning for a type I Brugada pattern with coving ST elevation in leads V1 through V2.  It is interesting that he has similar reciprocal changes in the inferior leads.  This goes against without a pattern.  He also has no family history of this disorder.  He does report what I suspect is coronary artery disease in her brother who died in his 45s.  He reports it was heart attacks that his brother did die of.  He reports he had a syncopal episode 20 years ago.  He reports when he walks he does get dizzy short of breath and lightheadedness.  He reports that he can feel his heart racing.  We do need to check a TSH today.  I would like to proceed with a 7-day Zio patch to exclude any underlying arrhythmia.  I  would also like him to be evaluated by electrophysiology.  His echocardiogram was normal.  He may need an EP study for further stratification.  He ultimately may need genetic testing and we can assist with this.  I think first and foremost he does need evaluation by electrophysiology to help [place] as the diagnosis is not clear.  Disposition: Return in about 3 months (around 05/14/2020).  Medication Adjustments/Labs and Tests Ordered: Current medicines are reviewed at length with the patient today.  Concerns regarding medicines are outlined above.  Orders Placed This Encounter  Procedures  . TSH  . Ambulatory referral to Cardiac Electrophysiology  . LONG TERM MONITOR (3-14 DAYS)  . EKG 12-Lead   No orders of the defined types were placed in this encounter.   Patient Instructions  Medication Instructions:  The current medical regimen is effective;  continue present plan and medications.  *If you need a refill on your cardiac medications before your next appointment, please call your pharmacy*   Lab Work: TSH today   If you have labs (blood work) drawn today and your tests are completely normal, you will receive your results only by: Marland Kitchen MyChart Message (if you have MyChart) OR . A paper copy in the mail If you have any lab test that is abnormal or we need to change your treatment, we will call you to review the results.   Testing/Procedures: Your physician has recommended that you wear a 7 DAY ZIO-PATCH monitor. The Zio patch cardiac monitor continuously records heart rhythm data for up to 14 days, this is for patients being evaluated for multiple types heart rhythms. For the first 24 hours post application, please avoid getting the Zio monitor wet in the shower or by excessive sweating during exercise. After that, feel free to carry on with regular activities. Keep soaps and lotions away from the ZIO XT Patch.  This  will be mailed to you, please expect 7-10 days to  receive.         Follow-Up: At [facility], you and your health needs are our priority.  As part of our continuing mission to provide you with exceptional heart care, we have created designated Provider Care Teams.  These Care Teams include your primary Cardiologist (physician) and Advanced Practice Providers (APPs -  Physician Assistants and Nurse Practitioners) who all work together to provide you with the care you need, when you need it.  We recommend signing up for the patient portal called "MyChart".  Sign up information is provided on this After Visit Summary.  MyChart is used to connect with patients for Virtual Visits (Telemedicine).  Patients are able to view lab/test results, encounter notes, upcoming appointments, etc.  Non-urgent messages can be sent to your provider as well.   To learn more about what you can do with MyChart, go to [email or web address].    Your next appointment:   3 month(s)  The format for your next appointment:   In Person  Provider:   Lennie Odor, MD   Other Instructions Referral to EP [facility])- they will be in contact for an appointment.     Signed, Lenna Gilford. Flora Lipps, MD [facility]  [address] [place], [place] [address] [phone]  02/12/2020 4:56 PM

## 2020-02-12 ENCOUNTER — Encounter: Payer: Self-pay | Admitting: Cardiovascular Disease

## 2020-02-12 ENCOUNTER — Other Ambulatory Visit: Payer: Self-pay

## 2020-02-12 ENCOUNTER — Ambulatory Visit (INDEPENDENT_AMBULATORY_CARE_PROVIDER_SITE_OTHER): Payer: Self-pay | Admitting: Cardiovascular Disease

## 2020-02-12 VITALS — BP 108/68 | HR 85 | Temp 98.1°F | Ht 65.0 in | Wt 126.0 lb

## 2020-02-12 DIAGNOSIS — I498 Other specified cardiac arrhythmias: Secondary | ICD-10-CM

## 2020-02-12 DIAGNOSIS — R42 Dizziness and giddiness: Secondary | ICD-10-CM

## 2020-02-12 DIAGNOSIS — R0602 Shortness of breath: Secondary | ICD-10-CM

## 2020-02-12 DIAGNOSIS — R9431 Abnormal electrocardiogram [ECG] [EKG]: Secondary | ICD-10-CM

## 2020-02-12 DIAGNOSIS — R002 Palpitations: Secondary | ICD-10-CM

## 2020-02-12 NOTE — Patient Instructions (Addendum)
Medication Instructions:  The current medical regimen is effective;  continue present plan and medications.  *If you need a refill on your cardiac medications before your next appointment, please call your pharmacy*   Lab Work: TSH today   If you have labs (blood work) drawn today and your tests are completely normal, you will receive your results only by: Marland Kitchen MyChart Message (if you have MyChart) OR . A paper copy in the mail If you have any lab test that is abnormal or we need to change your treatment, we will call you to review the results.   Testing/Procedures: Your physician has recommended that you wear a 7 DAY ZIO-PATCH monitor. The Zio patch cardiac monitor continuously records heart rhythm data for up to 14 days, this is for patients being evaluated for multiple types heart rhythms. For the first 24 hours post application, please avoid getting the Zio monitor wet in the shower or by excessive sweating during exercise. After that, feel free to carry on with regular activities. Keep soaps and lotions away from the ZIO XT Patch.  This will be mailed to you, please expect 7-10 days to receive.         Follow-Up: At [facility], you and your health needs are our priority.  As part of our continuing mission to provide you with exceptional heart care, we have created designated Provider Care Teams.  These Care Teams include your primary Cardiologist (physician) and Advanced Practice Providers (APPs -  Physician Assistants and Nurse Practitioners) who all work together to provide you with the care you need, when you need it.  We recommend signing up for the patient portal called "MyChart".  Sign up information is provided on this After Visit Summary.  MyChart is used to connect with patients for Virtual Visits (Telemedicine).  Patients are able to view lab/test results, encounter notes, upcoming appointments, etc.  Non-urgent messages can be sent to your provider as well.   To learn  more about what you can do with MyChart, go to [email or web address].    Your next appointment:   3 month(s)  The format for your next appointment:   In Person  Provider:   Lennie Odor, MD   Other Instructions Referral to EP [facility])- they will be in contact for an appointment.

## 2020-02-13 LAB — TSH: TSH: 1.29 u[IU]/mL (ref 0.450–4.500)

## 2020-02-16 ENCOUNTER — Other Ambulatory Visit (INDEPENDENT_AMBULATORY_CARE_PROVIDER_SITE_OTHER): Payer: Self-pay

## 2020-02-16 DIAGNOSIS — R002 Palpitations: Secondary | ICD-10-CM

## 2020-02-28 ENCOUNTER — Ambulatory Visit: Payer: Self-pay | Attending: Nurse Practitioner | Admitting: Nurse Practitioner

## 2020-02-28 ENCOUNTER — Other Ambulatory Visit: Payer: Self-pay

## 2020-02-28 ENCOUNTER — Encounter: Payer: Self-pay | Admitting: Nurse Practitioner

## 2020-02-28 DIAGNOSIS — Z7689 Persons encountering health services in other specified circumstances: Secondary | ICD-10-CM

## 2020-02-28 NOTE — Progress Notes (Signed)
Virtual Visit via Telephone Note Due to national recommendations of social distancing due to COVID 19, telehealth visit is felt to be most appropriate for this patient at this time.  I discussed the limitations, risks, security and privacy concerns of performing an evaluation and management service by telephone and the availability of in person appointments. I also discussed with the patient that there may be a patient responsible charge related to this service. The patient expressed understanding and agreed to proceed.    I connected with Earnstine Regal on 02/28/20  at   9:50 AM EDT  EDT by telephone and verified that I am speaking with the correct person using two identifiers.   Consent I discussed the limitations, risks, security and privacy concerns of performing an evaluation and management service by telephone and the availability of in person appointments. I also discussed with the patient that there may be a patient responsible charge related to this service. The patient expressed understanding and agreed to proceed.   Location of Patient: Private Residence    Location of Provider: [facility]    Persons participating in Telemedicine visit: [name] [place]Y [place] CMA Earnstine Regal    History of Present Illness: Telemedicine visit for: ESTABLISH CARE  Currently seeing Cardiology for suspected Brugada Syndrome after having an abnormal EKG here in the ED on 01-15-2020.He also has mild grade 1 diastolic dysfunction.   He denies any history of hypertension, diabetes, thyroid disorder or dyslipidemia.   Patient has been counseled on age-appropriate routine health concerns for screening and prevention. These are reviewed and up-to-date. Referrals have been placed accordingly. Immunizations are up-to-date or declined.    He has never had a colonoscopy.  Concerned about a "Bump" on the back on his neck between the shoulders. Size of quarter or half  dollar. Has been present for  "a couple of years".  He is unsure if it has increased in size over the past few years.  He denies any pain with palpation or signs of infection such as purulent drainage from the nodule.   Describes possible claudication symptoms with pain in his bilateral legs. Mostly noticed with activity such as walking.  Pain is worse when he is walking up an incline.  Right leg sometimes goes numb.  Onset of symptoms was 2 to 3 months ago.  He does have a history of smoking however states he stopped smoking and his last cigarette was smoked on 4 July weekend.    BP Readings from Last 3 Encounters:  02/12/20 108/68  01/24/20 100/64  01/15/20 126/84    Past Medical History:  Diagnosis Date  . Left ventricular diastolic dysfunction, NYHA class 1     Past Surgical History:  Procedure Laterality Date  . DENTAL SURGERY      Family History  Problem Relation Age of Onset  . Heart attack Mother   . Heart attack Brother 54    Social History   Socioeconomic History  . Marital status: Single    Spouse name: Not on file  . Number of children: 4  . Years of education: Not on file  . Highest education level: Not on file  Occupational History  . Occupation: cook  Tobacco Use  . Smoking status: Current Every Day Smoker    Packs/day: 25.00    Years: 0.50    Pack years: 12.50  . Smokeless tobacco: Never Used  Substance and Sexual Activity  . Alcohol use: No  .  Drug use: Yes    Types: Marijuana  . Sexual activity: Yes  Other Topics Concern  . Not on file  Social History Narrative  . Not on file   Social Determinants of Health   Financial Resource Strain:   . Difficulty of Paying Living Expenses:   Food Insecurity:   . Worried About Programme researcher, broadcasting/film/video in the Last Year:   . Barista in the Last Year:   Transportation Needs:   . Freight forwarder (Medical):   Marland Kitchen Lack of Transportation (Non-Medical):   Physical Activity:   . Days of Exercise per  Week:   . Minutes of Exercise per Session:   Stress:   . Feeling of Stress :   Social Connections:   . Frequency of Communication with Friends and Family:   . Frequency of Social Gatherings with Friends and Family:   . Attends Religious Services:   . Active Member of Clubs or Organizations:   . Attends Banker Meetings:   Marland Kitchen Marital Status:      Observations/Objective: Awake, alert and oriented x 3   Review of Systems  Constitutional: Negative for fever, malaise/fatigue and weight loss.  HENT: Negative.  Negative for nosebleeds.   Eyes: Negative.  Negative for blurred vision, double vision and photophobia.  Respiratory: Negative.  Negative for cough and shortness of breath.   Cardiovascular: Positive for claudication. Negative for chest pain, palpitations and leg swelling.  Gastrointestinal: Negative.  Negative for heartburn, nausea and vomiting.  Musculoskeletal: Negative.  Negative for myalgias.  Skin:       See HPI  Neurological: Negative.  Negative for dizziness, focal weakness, seizures and headaches.  Psychiatric/Behavioral: Negative.  Negative for suicidal ideas.    Assessment and Plan: Matthan was seen today for establish care.  Diagnoses and all orders for this visit:  Encounter to establish care Follow Up Instructions Return in about 4 weeks (around 03/27/2020) for Neck nodule and needs bilateral ABIs.     I discussed the assessment and treatment plan with the patient. The patient was provided an opportunity to ask questions and all were answered. The patient agreed with the plan and demonstrated an understanding of the instructions.   The patient was advised to call back or seek an in-person evaluation if the symptoms worsen or if the condition fails to improve as anticipated.  I provided 16 minutes of non-face-to-face time during this encounter including median intraservice time, reviewing previous notes, labs, imaging, medications and explaining  diagnosis and management.  Claiborne Rigg, FNP-BC

## 2020-03-04 ENCOUNTER — Ambulatory Visit: Payer: Self-pay | Attending: Nurse Practitioner

## 2020-03-04 ENCOUNTER — Other Ambulatory Visit: Payer: Self-pay

## 2020-03-15 ENCOUNTER — Ambulatory Visit: Payer: Self-pay | Attending: Family Medicine

## 2020-03-15 ENCOUNTER — Telehealth: Payer: Self-pay | Admitting: Nurse Practitioner

## 2020-03-15 ENCOUNTER — Other Ambulatory Visit: Payer: Self-pay

## 2020-03-15 NOTE — Telephone Encounter (Signed)
Patient is requesting an appt with Mikle Bosworth for next Thursday. To give him the correct information. [phone]

## 2020-03-18 NOTE — Telephone Encounter (Signed)
Pt allready saw me, he does not need to schedule a new appt just to drop up the missing documents, LVM

## 2020-03-28 ENCOUNTER — Telehealth: Payer: Self-pay | Admitting: Nurse Practitioner

## 2020-03-28 NOTE — Telephone Encounter (Signed)
Pt was inform, to contact the [facility], that he need to go the process to see if he can be qualified for [facility]

## 2020-04-01 ENCOUNTER — Other Ambulatory Visit: Payer: Self-pay

## 2020-04-01 ENCOUNTER — Ambulatory Visit: Payer: Self-pay | Attending: Nurse Practitioner | Admitting: Nurse Practitioner

## 2020-04-01 ENCOUNTER — Encounter: Payer: Self-pay | Admitting: Nurse Practitioner

## 2020-04-01 ENCOUNTER — Other Ambulatory Visit: Payer: Self-pay | Admitting: Nurse Practitioner

## 2020-04-01 VITALS — BP 116/73 | HR 66 | Temp 97.7°F | Ht 70.0 in | Wt 135.0 lb

## 2020-04-01 DIAGNOSIS — Z0000 Encounter for general adult medical examination without abnormal findings: Secondary | ICD-10-CM

## 2020-04-01 DIAGNOSIS — G629 Polyneuropathy, unspecified: Secondary | ICD-10-CM

## 2020-04-01 DIAGNOSIS — Z1211 Encounter for screening for malignant neoplasm of colon: Secondary | ICD-10-CM

## 2020-04-01 LAB — POCT ABI - SCREENING FOR PILOT NO CHARGE
Immediate ABI left: 0.98
Immediate ABI right: 1.18

## 2020-04-01 MED ORDER — GABAPENTIN 300 MG PO CAPS: 300.0000 mg | ORAL_CAPSULE | Freq: Three times a day (TID) | ORAL | 3 refills | Status: DC

## 2020-04-01 MED FILL — GABAPENTIN 300 MG CAPSULE: 300 | 30 days supply | Qty: 90 | Fill #0

## 2020-04-01 NOTE — Progress Notes (Signed)
Assessment & Plan:  Nicholas Espinoza was seen today for annual exam.  Diagnoses and all orders for this visit:  Encounter for annual physical exam  Neuropathy -     POCT ABI Screening Pilot No Charge -     gabapentin (NEURONTIN) 300 MG capsule; Take 1 capsule (300 mg total) by mouth 3 (three) times daily.  Colon cancer screening -     Fecal occult blood, imunochemical([facility])    Patient has been counseled on age-appropriate routine health concerns for screening and prevention. These are reviewed and up-to-date. Referrals have been placed accordingly. Immunizations are up-to-date or declined.    Subjective:   Chief Complaint  Patient presents with  . Annual Exam    Pt. is here for a physical exam.    HPI Nicholas Espinoza 59 y.o. male presents to office today for annual physical. He has complaints of left calf pain with walking and right leg and foot numbness when standing for long periods of time. He has a history of smoking 1ppd >40 years so will check ABI today. Pain is worse when walking up an incline. Onset of symptoms 4-5 months ago.    Review of Systems  Constitutional: Negative for fever, malaise/fatigue and weight loss.  Nicholas Espinoza: Negative.  Negative for nosebleeds.   Eyes: Negative.  Negative for blurred vision, double vision and photophobia.  Respiratory: Negative.  Negative for cough and shortness of breath.   Cardiovascular: Negative.  Negative for chest pain, palpitations and leg swelling.  Gastrointestinal: Negative.  Negative for heartburn, nausea and vomiting.  Musculoskeletal: Negative.  Negative for myalgias.  Neurological: Negative.  Negative for dizziness, focal weakness, seizures and headaches.  Psychiatric/Behavioral: Negative.  Negative for suicidal ideas.    Past Medical History:  Diagnosis Date  . Left ventricular diastolic dysfunction, NYHA class 1     Past Surgical History:  Procedure Laterality Date  . DENTAL SURGERY      Family History    Problem Relation Age of Onset  . Heart attack Mother   . Heart attack Brother 33    Social History Reviewed with no changes to be made today.   No outpatient medications prior to visit.   No facility-administered medications prior to visit.    No Known Allergies     Objective:    BP 116/73 (BP Location: Left Arm, Patient Position: Sitting, Cuff Size: Normal)   Pulse 66   Temp 97.7 F (36.5 C) (Temporal)   Ht 5\' 10"  (1.778 m)   Wt 135 lb (61.2 kg)   SpO2 98%   BMI 19.37 kg/m  Wt Readings from Last 3 Encounters:  04/01/20 135 lb (61.2 kg)  02/12/20 126 lb (57.2 kg)  01/24/20 126 lb (57.2 kg)    Physical Exam Constitutional:      Appearance: He is well-developed.  Nicholas Espinoza:     Head: Normocephalic and atraumatic.     Right Ear: Hearing, tympanic membrane, ear canal and external ear normal.     Left Ear: Hearing, tympanic membrane, ear canal and external ear normal.     Nose: Nose normal. No mucosal edema or rhinorrhea.     Mouth/Throat:     Pharynx: Uvula midline.     Tonsils: No tonsillar exudate. 1+ on the right. 1+ on the left.  Eyes:     General: Lids are normal. No scleral icterus.    Conjunctiva/sclera: Conjunctivae normal.     Pupils: Pupils are equal, round, and reactive to light.  Neck:  Thyroid: No thyromegaly.     Trachea: No tracheal deviation.   Cardiovascular:     Rate and Rhythm: Normal rate and regular rhythm.     Heart sounds: Normal heart sounds. No murmur heard.  No friction rub. No gallop.   Pulmonary:     Effort: Pulmonary effort is normal. No respiratory distress.     Breath sounds: Normal breath sounds. No wheezing or rales.  Chest:     Chest wall: No mass or tenderness.     Breasts:        Right: No inverted nipple, mass, nipple discharge, skin change or tenderness.        Left: No inverted nipple, mass, nipple discharge, skin change or tenderness.  Abdominal:     General: Bowel sounds are normal. There is no distension.      Palpations: Abdomen is soft. There is no mass.     Tenderness: There is no abdominal tenderness. There is no guarding or rebound.     Hernia: There is no hernia in the left inguinal area.  Genitourinary:    Penis: Normal.      Testes: Normal.        Right: Mass, tenderness or swelling not present. Right testis is descended. Cremasteric reflex is present.         Left: Mass, tenderness or swelling not present. Left testis is descended. Cremasteric reflex is present.   Musculoskeletal:        General: No tenderness or deformity. Normal range of motion.     Cervical back: Normal range of motion and neck supple.  Lymphadenopathy:     Cervical: No cervical adenopathy.     Lower Body: No right inguinal adenopathy. No left inguinal adenopathy.  Skin:    General: Skin is warm and dry.     Capillary Refill: Capillary refill takes less than 2 seconds.     Findings: No erythema.  Neurological:     Mental Status: He is alert and oriented to person, place, and time.     Cranial Nerves: No cranial nerve deficit.     Motor: No abnormal muscle tone.     Coordination: Coordination normal.     Deep Tendon Reflexes: Reflexes normal.  Psychiatric:        Behavior: Behavior normal.        Thought Content: Thought content normal.        Judgment: Judgment normal.          Patient has been counseled extensively about nutrition and exercise as well as the importance of adherence with medications and regular follow-up. The patient was given clear instructions to go to ER or return to medical center if symptoms don't improve, worsen or new problems develop. The patient verbalized understanding.   Follow-up: Return in about 3 months (around 07/02/2020).   Claiborne Rigg, FNP-BC [facility] [place], [place] [phone]   04/03/2020, 10:27 PM

## 2020-04-03 ENCOUNTER — Encounter: Payer: Self-pay | Admitting: Nurse Practitioner

## 2020-04-04 DIAGNOSIS — R002 Palpitations: Secondary | ICD-10-CM | POA: Insufficient documentation

## 2020-04-04 DIAGNOSIS — R9431 Abnormal electrocardiogram [ECG] [EKG]: Secondary | ICD-10-CM | POA: Insufficient documentation

## 2020-04-05 LAB — FECAL OCCULT BLOOD, IMMUNOCHEMICAL: Fecal Occult Bld: NEGATIVE

## 2020-04-08 ENCOUNTER — Other Ambulatory Visit: Payer: Self-pay

## 2020-04-08 ENCOUNTER — Encounter: Payer: Self-pay | Admitting: Internal Medicine

## 2020-04-08 ENCOUNTER — Ambulatory Visit (INDEPENDENT_AMBULATORY_CARE_PROVIDER_SITE_OTHER): Payer: Self-pay | Admitting: Internal Medicine

## 2020-04-08 VITALS — BP 114/84 | HR 72 | Ht 70.0 in | Wt 135.0 lb

## 2020-04-08 DIAGNOSIS — R9431 Abnormal electrocardiogram [ECG] [EKG]: Secondary | ICD-10-CM

## 2020-04-08 DIAGNOSIS — Z01812 Encounter for preprocedural laboratory examination: Secondary | ICD-10-CM

## 2020-04-08 DIAGNOSIS — R002 Palpitations: Secondary | ICD-10-CM

## 2020-04-08 DIAGNOSIS — R079 Chest pain, unspecified: Secondary | ICD-10-CM

## 2020-04-08 NOTE — Patient Instructions (Signed)
Medication Instructions:  Your physician recommends that you continue on your current medications as directed. Please refer to the Current Medication list given to you today.  *If you need a refill on your cardiac medications before your next appointment, please call your pharmacy*   Lab Work:  BMET prior to Coronary CT  If you have labs (blood work) drawn today and your tests are completely normal, you will receive your results only by: Marland Kitchen MyChart Message (if you have MyChart) OR . A paper copy in the mail If you have any lab test that is abnormal or we need to change your treatment, we will call you to review the results.   Testing/Procedures Your physician has requested that you have cardiac CT. Cardiac computed tomography (CT) is a painless test that uses an x-ray machine to take clear, detailed pictures of your heart. For further information please visit [email or web address] Please follow instruction sheet as given.     Follow-Up: At [facility], you and your health needs are our priority.  As part of our continuing mission to provide you with exceptional heart care, we have created designated Provider Care Teams.  These Care Teams include your primary Cardiologist (physician) and Advanced Practice Providers (APPs -  Physician Assistants and Nurse Practitioners) who all work together to provide you with the care you need, when you need it.  We recommend signing up for the patient portal called "MyChart".  Sign up information is provided on this After Visit Summary.  MyChart is used to connect with patients for Virtual Visits (Telemedicine).  Patients are able to view lab/test results, encounter notes, upcoming appointments, etc.  Non-urgent messages can be sent to your provider as well.   To learn more about what you can do with MyChart, go to [email or web address].    Your next appointment:   As needed with Dr Graciela Husbands

## 2020-04-08 NOTE — Progress Notes (Signed)
ELECTROPHYSIOLOGY CONSULT NOTE  Patient ID: Nicholas Espinoza, MRN: [id number], DOB/AGE: 02/17/1961 59 y.o. Admit date: (Not on file) Date of Consult: 04/08/2020  Primary Physician: Claiborne Rigg, NP Primary Cardiologist: Ilda Basset     Nicholas Espinoza is a 59 y.o. male who is being seen today for the evaluation of Brugada ECG at the request of Nicholas Espinoza.    HPI Nicholas Espinoza is a 59 y.o. male referred because of a spontaneous type I pattern Brugada ECG.  He was referred because of the abnormal ECG following presentation to the emergency room for dyspnea on exertion associated with some reproducible right shoulder discomfort.  No resting dyspnea.  No nocturnal dyspnea.  No peripheral edema.  Cardiac risk factors are notable for family history in his brother.  Reportedly he died of a heart attack at [facility], presume that that this was the correct diagnosis obtained during intervention.  Chart is not available for review  No other history of unexplained death in first-degree or second-degree relatives.  The patient has an episode of syncope about 20 years ago.  He has had a [employer].  Got nauseated hot and diaphoretic and walked to the front door and collapsed all the way.  DATE TEST EF   6/21 Echo   60-65 %         Date Cr K Hgb  6/21 0.9 4.9 12.8    Zio patch was obtained and personally reviewed and demonstrated only infrequent isolated supraventricular and ventricular ectopy  Past Medical History:  Diagnosis Date  . Left ventricular diastolic dysfunction, NYHA class 1       Surgical History:  Past Surgical History:  Procedure Laterality Date  . DENTAL SURGERY       Home Meds: Current Meds  Medication Sig  . gabapentin (NEURONTIN) 300 MG capsule Take 1 capsule (300 mg total) by mouth 3 (three) times daily.  . [DISCONTINUED] albuterol (PROVENTIL HFA;VENTOLIN HFA) 108 (90 Base) MCG/ACT inhaler Inhale 2 puffs into the lungs every 6 (six) hours as needed for wheezing or  shortness of breath.    Allergies: No Known Allergies  Social History   Socioeconomic History  . Marital status: Single    Spouse name: Not on file  . Number of children: 4  . Years of education: Not on file  . Highest education level: Not on file  Occupational History  . Occupation: unemployed  Tobacco Use  . Smoking status: Current Some Day Smoker    Packs/day: 25.00    Years: 0.50    Pack years: 12.50  . Smokeless tobacco: Never Used  . Tobacco comment: less then 1 cigarette daily  Vaping Use  . Vaping Use: Never used  Substance and Sexual Activity  . Alcohol use: Yes  . Drug use: Yes    Types: Marijuana  . Sexual activity: Yes  Other Topics Concern  . Not on file  Social History Narrative  . Not on file   Social Determinants of Health   Financial Resource Strain:   . Difficulty of Paying Living Expenses:   Food Insecurity:   . Worried About Programme researcher, broadcasting/film/video in the Last Year:   . Barista in the Last Year:   Transportation Needs:   . Freight forwarder (Medical):   Marland Kitchen Lack of Transportation (Non-Medical):   Physical Activity:   . Days of Exercise per Week:   . Minutes of Exercise per Session:   Stress:   .  Feeling of Stress :   Social Connections:   . Frequency of Communication with Friends and Family:   . Frequency of Social Gatherings with Friends and Family:   . Attends Religious Services:   . Active Member of Clubs or Organizations:   . Attends Banker Meetings:   Marland Kitchen Marital Status:   Intimate Partner Violence:   . Fear of Current or Ex-Partner:   . Emotionally Abused:   Marland Kitchen Physically Abused:   . Sexually Abused:      Family History  Problem Relation Age of Onset  . Heart attack Mother   . Heart attack Brother 44  . Cancer - Other Father   . Diabetes Sister   . Diabetes Sister      ROS:  Please see the history of present illness.     All other systems reviewed and negative.    Physical Exam:  Blood pressure  114/84, pulse 72, height 5\' 10"  (1.778 m), weight 135 lb (61.2 kg), SpO2 99 %. Well developed and nourished in no acute distress HENT normal Neck supple with JVP-  Flat  Clear Regular rate and rhythm, no murmurs or gallops Abd-soft with active BS No Clubbing cyanosis edema Skin-warm and dry A & Oriented  Grossly normal sensory and motor function        Labs: Cardiac Enzymes No results for input(s): CKTOTAL, CKMB, TROPONINI in the last 72 hours. CBC Lab Results  Component Value Date   WBC 13.5 (H) 01/24/2020   HGB 12.8 (L) 01/24/2020   HCT 39.3 01/24/2020   MCV 98 (H) 01/24/2020   PLT 266 01/24/2020   PROTIME: No results for input(s): LABPROT, INR in the last 72 hours. Chemistry No results for input(s): NA, K, CL, CO2, BUN, CREATININE, CALCIUM, PROT, BILITOT, ALKPHOS, ALT, AST, GLUCOSE in the last 168 hours.  Invalid input(s): LABALBU Lipids No results found for: CHOL, HDL, LDLCALC, TRIG BNP No results found for: PROBNP Thyroid Function Tests: No results for input(s): TSH, T4TOTAL, T3FREE, THYROIDAB in the last 72 hours.  Invalid input(s): FREET3 Miscellaneous No results found for: DDIMER  Radiology/Studies:  LONG TERM MONITOR (3-14 DAYS)  Result Date: 03/13/2020 Enrollment 02/16/2020-02/22/2020 (5 days 23 hours). Patient had a min HR of 47 bpm (sinus bradycardia), max HR of 139 bpm (sinus tachycardia), and avg HR of 79 bpm (normal sinus rhythm). Predominant underlying rhythm was Sinus Rhythm. Isolated SVEs were rare (<1.0%), SVE Triplets were rare (<1.0%), and no SVE Couplets were present. Isolated VEs were rare (<1.0%), VE Couplets were rare (<1.0%), and no VE Triplets were present. No diary submitted. Impression: 1. No arrhythmias detected. 2. Rare ectopy. 04/24/2020 T. 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  In the absence of symptoms, will classify him not as Brugada syndrome but has a Brugada type ECG.  At this point, there is nothing indicated for  treating the risk of sudden death but I have given him the list of drugs to avoid and the importance of treating fever aggressively.  With the Brugada pattern, genetic testing is not indicated.  His exertional dyspnea with predictable reproducible right shoulder discomfort is concerning for angina.  We will undertake coronary CTA.  No further EP follow-up is indicated          Nicholas Espinoza

## 2020-04-29 NOTE — Progress Notes (Signed)
Cardiology Office Note:   Date:  05/01/2020  NAME:  Nicholas Espinoza    MRN: [id number] DOB:  1961-05-22   PCP:  Claiborne Rigg, NP  Cardiologist:  No primary care provider on file.   Referring MD: No ref. provider found   Chief Complaint  Patient presents with  . Follow-up   History of Present Illness:   Nicholas Espinoza is a 59 y.o. male with a hx of Brugada pattern who presents for follow-up. He was evaluated with SOB and palpitations. EKG with Brugada pattern. Evaluated by EP. CCTA ordered. Zio negative for arrhythmia.  Reports he is doing fairly well.  He reports he has not had any further chest pain or shortness of breath.  No further palpitations.  We did go over his recent echo and recent monitor which showed no arrhythmia.  This is reassuring.  He reports that overall things are better.  He does have plans to complete a cardiac CT per electrophysiology.  He does report symptoms of cramping in his left lower extremity.  He reports when he walks he does get exertional symptoms.  Pain resolved with cessation of activity.  He also gets right leg pain when standing.  Pulses are diminished on the left.  He did have recent ABIs which were normal.  I am a little concerned that something was missed here.  He is still smoking 3 to 4 cigarettes/day.  Trying to quit.  He is never had a lipid profile test that I can tell.  Most recent labs show he is not diabetic with an A1c of 5.4.  TSH was normal as well.  He denies symptoms of chest pain, shortness of breath or palpitations.  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 Tobacco abuse   Past Medical History: Past Medical History:  Diagnosis Date  . Left ventricular diastolic dysfunction, NYHA class 1     Past Surgical History: Past Surgical History:  Procedure Laterality Date  . DENTAL SURGERY      Current Medications: Current Meds  Medication Sig  . gabapentin (NEURONTIN) 300 MG capsule Take 1 capsule  (300 mg total) by mouth 3 (three) times daily.     Allergies:    Patient has no known allergies.   Social History: Social History   Socioeconomic History  . Marital status: Single    Spouse name: Not on file  . Number of children: 4  . Years of education: Not on file  . Highest education level: Not on file  Occupational History  . Occupation: unemployed  Tobacco Use  . Smoking status: Current Some Day Smoker    Packs/day: 25.00    Years: 0.50    Pack years: 12.50  . Smokeless tobacco: Never Used  . Tobacco comment: less then 1 cigarette daily  Vaping Use  . Vaping Use: Never used  Substance and Sexual Activity  . Alcohol use: Yes  . Drug use: Yes    Types: Marijuana  . Sexual activity: Yes  Other Topics Concern  . Not on file  Social History Narrative  . Not on file   Social Determinants of Health   Financial Resource Strain:   . Difficulty of Paying Living Expenses: Not on file  Food Insecurity:   . Worried About Programme researcher, broadcasting/film/video in the Last Year: Not on file  . Ran Out of Food in the Last Year: Not on file  Transportation Needs:   . Lack of  Transportation (Medical): Not on file  . Lack of Transportation (Non-Medical): Not on file  Physical Activity:   . Days of Exercise per Week: Not on file  . Minutes of Exercise per Session: Not on file  Stress:   . Feeling of Stress : Not on file  Social Connections:   . Frequency of Communication with Friends and Family: Not on file  . Frequency of Social Gatherings with Friends and Family: Not on file  . Attends Religious Services: Not on file  . Active Member of Clubs or Organizations: Not on file  . Attends Banker Meetings: Not on file  . Marital Status: Not on file     Family History: The patient's family history includes Cancer - Other in his father; Diabetes in his sister and sister; Heart attack in his mother; Heart attack (age of onset: 73) in his brother.  ROS:   All other ROS reviewed  and negative. Pertinent positives noted in the HPI.     EKGs/Labs/Other Studies Reviewed:   The following studies were personally reviewed by me today:  Zio 02/16/2020 1. No arrhythmias detected.  2. Rare ectopy.   TTE 01/30/2020 1. Left ventricular ejection fraction, by estimation, is 65 to 70%. The  left ventricle has normal function. The left ventricle has no regional  wall motion abnormalities. Left ventricular diastolic parameters are  consistent with Grade I diastolic  dysfunction (impaired relaxation).  2. Right ventricular systolic function is normal. The right ventricular  size is normal.  3. The mitral valve is normal in structure. Trivial mitral valve  regurgitation. No evidence of mitral stenosis.  4. The aortic valve is normal in structure. Aortic valve regurgitation is  not visualized. No aortic stenosis is present.  5. The inferior vena cava is normal in size with greater than 50%  respiratory variability, suggesting right atrial pressure of 3 mmHg.  Recent Labs: 01/15/2020: B Natriuretic Peptide 35.3 01/24/2020: BUN 19; Creatinine, Ser 0.90; Hemoglobin 12.8; Platelets 266; Potassium 4.9; Sodium 142 02/12/2020: TSH 1.290   Recent Lipid Panel No results found for: CHOL, TRIG, HDL, CHOLHDL, VLDL, LDLCALC, LDLDIRECT  Physical Exam:   VS:  BP (!) 114/92   Pulse 68   Ht 5\' 10"  (1.778 m)   Wt 134 lb 6.4 oz (61 kg)   SpO2 99%   BMI 19.28 kg/m    Wt Readings from Last 3 Encounters:  05/01/20 134 lb 6.4 oz (61 kg)  04/08/20 135 lb (61.2 kg)  04/01/20 135 lb (61.2 kg)    General: Well nourished, well developed, in no acute distress Heart: Atraumatic, normal size  Eyes: PEERLA, EOMI  Neck: Supple, no JVD Endocrine: No thryomegaly Cardiac: Normal S1, S2; RRR; no murmurs, rubs, or gallops Lungs: Clear to auscultation bilaterally, no wheezing, rhonchi or rales  Abd: Soft, nontender, no hepatomegaly  Ext: No edema, 2+ pulses in the right lower extremity, absent  pulses in the left lower extremity Musculoskeletal: No deformities, BUE and BLE strength normal and equal Skin: Warm and dry, no rashes   Neuro: Alert and oriented to person, place, time, and situation, CNII-XII grossly intact, no focal deficits  Psych: Normal mood and affect   ASSESSMENT:   YUMA PACELLA is a 59 y.o. male who presents for the following: 1. Abnormal ECG   2. Palpitations   3. SOB (shortness of breath)   4. Other chest pain   5. Pain of left lower extremity   6. Mixed hyperlipidemia  PLAN:   1. Abnormal ECG -He has Brugada type I pattern on his ECG.  He has no symptoms to suggest he has a syndrome.  He has had no syncope.  He was evaluated by electrophysiology.  No ICD is indicated.  He was given information about medications to avoid as well as fever.  2. Palpitations -Recent ZIO was negative.  3. SOB (shortness of breath) -Symptoms have improved.  Likely smoking related.  May have to consider PFTs if symptoms continue.  4. Other chest pain -Chest pain has resolved.  Atypical chest pain.  Cardiac CT ordered.  5. Pain of left lower extremity -He does report some cramping in the left lower extremity.  Absent pulses in the left lower extremity.  Recent ABIs were normal.  We will proceed with vascular ultrasounds of the lower extremities.  6. Mixed hyperlipidemia -Need a lipid profile.  Ordered today.  He is fasting.  Disposition: Return in about 6 months (around 10/29/2020).  Medication Adjustments/Labs and Tests Ordered: Current medicines are reviewed at length with the patient today.  Concerns regarding medicines are outlined above.  Orders Placed This Encounter  Procedures  . Lipid panel  . VAS US LOWER EXTREMITY ARTERIAL DUPLEX   No orders of the defined types were placed in this encounter.   Patient Instructions  Medication Instructions:  The current medical regimen is effective;  continue present plan and medications.  *If you need a refill on  your cardiac medications before your next appointment, please call your pharmacy*   Lab Work: LIPID today   If you have labs (blood work) drawn today and your tests are completely normal, you will receive your results only by: Marland Kitchen. MyChart Message (if you have MyChart) OR . A paper copy in the mail If you have any lab test that is abnormal or we need to change your treatment, we will call you to review the results.   Testing/Procedures: Your physician has requested that you have a lower or upper extremity arterial duplex. This test is an ultrasound of the arteries in the legs or arms. It looks at arterial blood flow in the legs and arms. Allow one hour for Lower and Upper Arterial scans. There are no restrictions or special instructions   Follow-Up: At [facility], you and your health needs are our priority.  As part of our continuing mission to provide you with exceptional heart care, we have created designated Provider Care Teams.  These Care Teams include your primary Cardiologist (physician) and Advanced Practice Providers (APPs -  Physician Assistants and Nurse Practitioners) who all work together to provide you with the care you need, when you need it.  We recommend signing up for the patient portal called "MyChart".  Sign up information is provided on this After Visit Summary.  MyChart is used to connect with patients for Virtual Visits (Telemedicine).  Patients are able to view lab/test results, encounter notes, upcoming appointments, etc.  Non-urgent messages can be sent to your provider as well.   To learn more about what you can do with MyChart, go to [email or web address].    Your next appointment:   6 month(s)  The format for your next appointment:   In Person  Provider:   Lennie OdorWesley O'Neal, MD        Time Spent with Patient: I have spent a total of 35 minutes with patient reviewing hospital notes, telemetry, EKGs, labs and examining the patient as well as establishing  an assessment and plan that  was discussed with the patient.  > 50% of time was spent in direct patient care.  Signed, Lenna Gilford. Flora Lipps, MD [facility]  [address], [address] [place], [place] [address] [phone]  05/01/2020 10:49 AM

## 2020-05-01 ENCOUNTER — Other Ambulatory Visit: Payer: Self-pay

## 2020-05-01 ENCOUNTER — Encounter: Payer: Self-pay | Admitting: Cardiovascular Disease

## 2020-05-01 ENCOUNTER — Ambulatory Visit (INDEPENDENT_AMBULATORY_CARE_PROVIDER_SITE_OTHER): Payer: Self-pay | Admitting: Cardiovascular Disease

## 2020-05-01 VITALS — BP 114/92 | HR 68 | Ht 70.0 in | Wt 134.4 lb

## 2020-05-01 DIAGNOSIS — R0602 Shortness of breath: Secondary | ICD-10-CM

## 2020-05-01 DIAGNOSIS — M79605 Pain in left leg: Secondary | ICD-10-CM

## 2020-05-01 DIAGNOSIS — R9431 Abnormal electrocardiogram [ECG] [EKG]: Secondary | ICD-10-CM

## 2020-05-01 DIAGNOSIS — E782 Mixed hyperlipidemia: Secondary | ICD-10-CM

## 2020-05-01 DIAGNOSIS — R0789 Other chest pain: Secondary | ICD-10-CM

## 2020-05-01 DIAGNOSIS — R002 Palpitations: Secondary | ICD-10-CM

## 2020-05-01 LAB — LIPID PANEL
Chol/HDL Ratio: 3.1 ratio (ref 0.0–5.0)
Cholesterol, Total: 169 mg/dL (ref 100–199)
HDL: 54 mg/dL (ref >39)
LDL Chol Calc (NIH): 100 mg/dL — ABNORMAL HIGH (ref 0–99)
Triglycerides: 79 mg/dL (ref 0–149)
VLDL Cholesterol Cal: 15 mg/dL (ref 5–40)

## 2020-05-01 NOTE — Patient Instructions (Signed)
Medication Instructions:  The current medical regimen is effective;  continue present plan and medications.  *If you need a refill on your cardiac medications before your next appointment, please call your pharmacy*   Lab Work: LIPID today   If you have labs (blood work) drawn today and your tests are completely normal, you will receive your results only by: Marland Kitchen MyChart Message (if you have MyChart) OR . A paper copy in the mail If you have any lab test that is abnormal or we need to change your treatment, we will call you to review the results.   Testing/Procedures: Your physician has requested that you have a lower or upper extremity arterial duplex. This test is an ultrasound of the arteries in the legs or arms. It looks at arterial blood flow in the legs and arms. Allow one hour for Lower and Upper Arterial scans. There are no restrictions or special instructions   Follow-Up: At [facility], you and your health needs are our priority.  As part of our continuing mission to provide you with exceptional heart care, we have created designated Provider Care Teams.  These Care Teams include your primary Cardiologist (physician) and Advanced Practice Providers (APPs -  Physician Assistants and Nurse Practitioners) who all work together to provide you with the care you need, when you need it.  We recommend signing up for the patient portal called "MyChart".  Sign up information is provided on this After Visit Summary.  MyChart is used to connect with patients for Virtual Visits (Telemedicine).  Patients are able to view lab/test results, encounter notes, upcoming appointments, etc.  Non-urgent messages can be sent to your provider as well.   To learn more about what you can do with MyChart, go to [email or web address].    Your next appointment:   6 month(s)  The format for your next appointment:   In Person  Provider:   Lennie Odor, MD

## 2020-05-13 ENCOUNTER — Other Ambulatory Visit (HOSPITAL_COMMUNITY): Payer: Self-pay | Admitting: Cardiovascular Disease

## 2020-05-13 DIAGNOSIS — I739 Peripheral vascular disease, unspecified: Secondary | ICD-10-CM

## 2020-05-21 ENCOUNTER — Other Ambulatory Visit: Payer: Self-pay

## 2020-05-21 ENCOUNTER — Ambulatory Visit (HOSPITAL_COMMUNITY)
Admission: RE | Admit: 2020-05-21 | Discharge: 2020-05-21 | Disposition: A | Discharge status: Home / Self Care | Payer: Self-pay | Source: Ambulatory Visit | Attending: Cardiovascular Disease | Admitting: Cardiovascular Disease

## 2020-05-21 DIAGNOSIS — M79605 Pain in left leg: Secondary | ICD-10-CM | POA: Insufficient documentation

## 2020-05-21 DIAGNOSIS — I739 Peripheral vascular disease, unspecified: Secondary | ICD-10-CM | POA: Insufficient documentation

## 2020-06-07 ENCOUNTER — Ambulatory Visit: Payer: Self-pay | Admitting: *Deleted

## 2020-06-07 NOTE — Telephone Encounter (Signed)
Patient is calling because he tested positive for COVID. Patient reports chills, coughing,congestion, and is wanting to know what he can take OTC. Patient's PCP does not have appts until November. Please advise  Patient triaged for symptoms and given home care advise. Patient advised if OTC medication do not seem to be helping- then would schedule virtual visit/UC for help with symptoms. Advised of severe symptoms and when to seek help.  Reason for Disposition . [1] COVID-19 diagnosed by positive lab test AND [2] mild symptoms (e.g., cough, fever, others) AND [3] no complications or SOB  Answer Assessment - Initial Assessment Questions 1. 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 COVID-19 EXPOSURE: "Was there any known exposure to COVID before the symptoms began?" CDC Definition of close contact: within 6 feet (2 meters) for a total of 15 minutes or more over a 24-hour period.      No known exposure 3. ONSET: "When did the COVID-19 symptoms start?"      Tuesday night 4. WORST SYMPTOM: "What is your worst symptom?" (e.g., cough, fever, shortness of breath, muscle aches)     Cold chills, body aches 5. COUGH: "Do you have a cough?" If Yes, ask: "How bad is the cough?"       Cough- slight-congested cough  6. FEVER: "Do you have a fever?" If Yes, ask: "What is your temperature, how was it measured, and when did it start?"     unknown 7. RESPIRATORY STATUS: "Describe your breathing?" (e.g., shortness of breath, wheezing, unable to speak)      Normal breathing 8. BETTER-SAME-WORSE: "Are you getting better, staying the same or getting worse compared to yesterday?"  If getting worse, ask, "In what way?"     unsure 9. HIGH RISK DISEASE: "Do you have any chronic medical  problems?" (e.g., asthma, heart or lung disease, weak immune system, obesity, etc.)     no 10. PREGNANCY: "Is there any chance you are pregnant?" "When was your last menstrual period?"       n/a 11. OTHER SYMPTOMS: "Do you have any other symptoms?"  (e.g., chills, fatigue, headache, loss of smell or taste, muscle pain, sore throat; new loss of smell or taste especially support the diagnosis of COVID-19)       Headache, fatigue  Protocols used: CORONAVIRUS (COVID-19) DIAGNOSED OR SUSPECTED-A-AH

## 2020-06-09 NOTE — Telephone Encounter (Signed)
Agree with Advice given

## 2020-06-11 ENCOUNTER — Ambulatory Visit: Payer: Self-pay

## 2020-06-11 NOTE — Telephone Encounter (Signed)
Pt. Asking how long he needs to quarantine. Instructed that with symptoms he needs to quarantine x 10 days unless he is running a fever or still congested. Verbalizes understanding.  DH 2  Nicholas Espinoza. [facility] Male, 59 y.o., [birth year] MRN:  [id number] Phone:  [phone] (M) PCP:  Claiborne Rigg, NP Primary Cvg:  None Next Appt With Cardiology 10/29/2020 at 8:00 AM Message from Geronimo Boot sent at 06/11/2020 2:22 PM EDT  Summary: Cliniacl Advice   Patient would like to speak with nurse in regards to testing positive for covid and how long the virus will stay in his systems. Please advise         Call History   Type Contact Phone User  06/11/2020 02:22 PM EDT Phone (Incoming) [name], [name] (Self) [phone] [name]) [name], [name]

## 2020-06-12 ENCOUNTER — Ambulatory Visit: Payer: Self-pay | Admitting: Cardiovascular Disease

## 2020-06-13 ENCOUNTER — Ambulatory Visit: Payer: Self-pay | Admitting: *Deleted

## 2020-06-13 NOTE — Telephone Encounter (Signed)
Per inititial encounter, " Patient called to say that he come out of quarantine tomorrow and also have an appointment for his first vaccine want a call back from a nurse to discuss if this is ok or not."; contacted pt regarding his concerns; the pt says he is not having symptoms and has met the criteria to stop his quarantine on 06/14/20; recommendations made per nurse triage protocol; he verbalized understanding; the pt is seen by Geryl Rankins, [facility]; will route to office for notification of ecounter.    Reason for Disposition  COVID-19 vaccine, Frequently Asked Questions (FAQs)  Protocols used: CORONAVIRUS (COVID-19) VACCINE QUESTIONS AND REACTIONS-A-AH

## 2020-06-13 NOTE — Telephone Encounter (Signed)
Spoke to patient. Pt. Stated he is going to postpone his first dose of COVID vaccine and wait till he get tested at his job again on Tuesday.

## 2020-07-04 MED FILL — GABAPENTIN 300 MG CAPSULE: 300 | 30 days supply | Qty: 90 | Fill #1

## 2020-07-05 ENCOUNTER — Telehealth (HOSPITAL_COMMUNITY): Payer: Self-pay | Admitting: *Deleted

## 2020-07-05 NOTE — Telephone Encounter (Signed)
Reaching out to patient to offer assistance regarding upcoming cardiac imaging study; pt verbalizes understanding of appt date/time, parking situation and where to check in, pre-test NPO status and medications ordered, and verified current allergies; name and call back number provided for further questions should they arise ° °Grissel Tyrell Tai RN Navigator Cardiac Imaging °[facility] °[phone] office °[phone] cell ° °

## 2020-07-08 ENCOUNTER — Ambulatory Visit (HOSPITAL_COMMUNITY)
Admission: RE | Admit: 2020-07-08 | Discharge: 2020-07-08 | Disposition: A | Discharge status: Home / Self Care | Payer: Self-pay | Source: Ambulatory Visit | Attending: Internal Medicine | Admitting: Internal Medicine

## 2020-07-08 ENCOUNTER — Other Ambulatory Visit: Payer: Self-pay

## 2020-07-08 DIAGNOSIS — R9431 Abnormal electrocardiogram [ECG] [EKG]: Secondary | ICD-10-CM | POA: Insufficient documentation

## 2020-07-08 DIAGNOSIS — R079 Chest pain, unspecified: Secondary | ICD-10-CM | POA: Insufficient documentation

## 2020-07-08 MED ORDER — IOHEXOL 350 MG/ML SOLN
80.0000 mL | Freq: Once | INTRAVENOUS | Status: AC | PRN
  Administered 2020-07-08: 80 mL via INTRAVENOUS

## 2020-07-08 MED ORDER — NITROGLYCERIN 0.4 MG SL SUBL
0.8000 mg | SUBLINGUAL_TABLET | Freq: Once | SUBLINGUAL | Status: AC
  Administered 2020-07-08: 0.8 mg via SUBLINGUAL

## 2020-07-08 MED ORDER — NITROGLYCERIN 0.4 MG SL SUBL
SUBLINGUAL_TABLET | SUBLINGUAL | Status: AC
  Filled 2020-07-08: qty 2

## 2020-07-10 ENCOUNTER — Telehealth: Payer: Self-pay | Admitting: Internal Medicine

## 2020-07-10 NOTE — Telephone Encounter (Signed)
Patient calling for results.

## 2020-07-10 NOTE — Telephone Encounter (Signed)
Spoke with Nicholas Espinoza and advised Nicholas Espinoza's coronary CT has not yet been reviewed and signed by Dr Graciela Husbands.  Once this has happened we will contact Nicholas Espinoza with results.\\Nicholas Espinoza verbalizes understanding and agrees with current plan.

## 2020-07-15 ENCOUNTER — Telehealth: Payer: Self-pay

## 2020-07-15 NOTE — Telephone Encounter (Signed)
Spoke with pt and advised per Dr Graciela Husbands study is mostly normal with evidence of some mild coronary plaquing but no significant obstruction.  !0 year cardiac risk is sufficiently low and statin therapy is not necessary.  Pt verbalizes understanding and thanked Charity fundraiser for call.

## 2020-07-15 NOTE — Telephone Encounter (Signed)
-----   Message from Duke Salvia, MD sent at 07/13/2020  9:59 AM EST ----- Please Inform Patient that study was mostly normal with evidence of mild coronary plaquing but not significant obstruction His 10 yr cardiac risk is sufficiently low that no statin therapy is indicated  Thanks

## 2020-07-30 ENCOUNTER — Other Ambulatory Visit: Payer: Self-pay

## 2020-07-30 ENCOUNTER — Encounter: Payer: Self-pay | Admitting: Cardiovascular Disease

## 2020-07-30 ENCOUNTER — Ambulatory Visit (INDEPENDENT_AMBULATORY_CARE_PROVIDER_SITE_OTHER): Payer: Self-pay | Admitting: Cardiovascular Disease

## 2020-07-30 VITALS — BP 124/90 | HR 60 | Ht 70.0 in | Wt 137.0 lb

## 2020-07-30 DIAGNOSIS — R9431 Abnormal electrocardiogram [ECG] [EKG]: Secondary | ICD-10-CM

## 2020-07-30 DIAGNOSIS — R002 Palpitations: Secondary | ICD-10-CM

## 2020-07-30 DIAGNOSIS — I739 Peripheral vascular disease, unspecified: Secondary | ICD-10-CM

## 2020-07-30 DIAGNOSIS — Z01812 Encounter for preprocedural laboratory examination: Secondary | ICD-10-CM

## 2020-07-30 NOTE — Patient Instructions (Addendum)
    [facility] CARDIOVASCULAR DIVISION [facility] [address] [place] [address] Dept: [phone] Loc: [phone]  CORBETT MOULDER  07/30/2020  You are scheduled for a Peripheral Angiogram on Monday, January 10 with Dr. Nanetta Batty.  1. Please arrive at the [facility] (Main Entrance A) at [facility]: [address] [place], [place] [address] at 5:30 AM (This time is two hours before your procedure to ensure your preparation). Free valet parking service is available.   Special note: Every effort is made to have your procedure done on time. Please understand that emergencies sometimes delay scheduled procedures.  2. Diet: Do not eat solid foods after midnight.  The patient may have clear liquids until 5am upon the day of the procedure.  3. Labs: You will need to have blood drawn in the 2-3 weeks.  4. Medication instructions in preparation for your procedure:  On the morning of your procedure, take your Aspirin and any morning medicines NOT listed above.  You may use sips of water.  5. Plan for one night stay--bring personal belongings. 6. Bring a current list of your medications and current insurance cards. 7. You MUST have a responsible person to drive you home. 8. Someone MUST be with you the first 24 hours after you arrive home or your discharge will be delayed. 9. Please wear clothes that are easy to get on and off and wear slip-on shoes.  Thank you for allowing [place] to care for you!   --  Invasive Cardiovascular services  You will need a COVID-19  test prior to your procedure. You are scheduled for 08/31/20 at 9:10am . This is a Drive Up Visit at [address] [place], [place] [address]. Someone will direct you to the appropriate testing line. Stay in your car and someone will be with you shortly.  You will need follow up lower extremity vascular ultrasound 1 week after procedure.  You will need  a follow up appointment with Dr. Allyson Sabal 2 weeks after procedure.

## 2020-07-30 NOTE — Assessment & Plan Note (Signed)
Nicholas Espinoza was referred to me by Dr. Flora Lipps for symptomatic PAD.  He does complain of left lower extremity claudication.  Dopplers performed 05/21/2020 revealed a right ABI of 1.17 and a left of 0.87.  He had a high-frequency signal in his mid left common iliac artery.  He has a diminished left common femoral pulse with a high-frequency bruit.  He wishes to proceed with angiography potential endovascular therapy for lifestyle limiting claudication.

## 2020-07-30 NOTE — Progress Notes (Signed)
07/30/2020 JAYDN MOSCATO   1961-05-20  [id number]  Primary Physician Claiborne Rigg, NP Primary Cardiologist: Runell Gess MD [name], [place]  HPI:  Nicholas Espinoza is a 59 y.o. 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  He was referred to me by Dr. Flora Lipps, his primary cardiologist, for evaluation of symptomatic PAD.  He does have a history of tobacco abuse currently smoking 1 to 2 cigarettes every several weeks down from a pack a week prior to that.  He has no history of diabetes, hypertension or hyperlipidemia.  Apparently his brother did die of a myocardial infarction.  Is never had a heart attack or stroke.  Denies chest pain or shortness of breath.  He said claudication of his left lower extremity for the last 6 months or so with Dopplers that revealed a right ABI of 1.17 and a left of 0.87 with what appears to be a high-frequency signal/hemodynamically significant lesion in the mid left common iliac artery.   Current Meds  Medication Sig  . gabapentin (NEURONTIN) 300 MG capsule Take 1 capsule (300 mg total) by mouth 3 (three) times daily.     No Known Allergies  Social History   Socioeconomic History  . Marital status: Single    Spouse name: Not on file  . Number of children: 4  . Years of education: Not on file  . Highest education level: Not on file  Occupational History  . Occupation: unemployed  Tobacco Use  . Smoking status: Current Some Day Smoker    Packs/day: 25.00    Years: 0.50    Pack years: 12.50  . Smokeless tobacco: Never Used  . Tobacco comment: less then 1 cigarette daily  Vaping Use  . Vaping Use: Never used  Substance and Sexual Activity  . Alcohol use: Yes  . Drug use: Yes    Types: Marijuana  . Sexual activity: Yes  Other Topics Concern  . Not on file  Social History Narrative  . Not on file   Social Determinants of Health    Financial Resource Strain:   . Difficulty of Paying Living Expenses: Not on file  Food Insecurity:   . Worried About Programme researcher, broadcasting/film/video in the Last Year: Not on file  . Ran Out of Food in the Last Year: Not on file  Transportation Needs:   . Lack of Transportation (Medical): Not on file  . Lack of Transportation (Non-Medical): Not on file  Physical Activity:   . Days of Exercise per Week: Not on file  . Minutes of Exercise per Session: Not on file  Stress:   . Feeling of Stress : Not on file  Social Connections:   . Frequency of Communication with Friends and Family: Not on file  . Frequency of Social Gatherings with Friends and Family: Not on file  . Attends Religious Services: Not on file  . Active Member of Clubs or Organizations: Not on file  . Attends Banker Meetings: Not on file  . Marital Status: Not on file  Intimate Partner Violence:   . Fear of Current or Ex-Partner: Not on file  . Emotionally Abused: Not on file  . Physically Abused: Not on file  . Sexually Abused: Not on file     Review of Systems: General: negative for chills, fever, night sweats or weight changes.  Cardiovascular: negative  for chest pain, dyspnea on exertion, edema, orthopnea, palpitations, paroxysmal nocturnal dyspnea or shortness of breath Dermatological: negative for rash Respiratory: negative for cough or wheezing Urologic: negative for hematuria Abdominal: negative for nausea, vomiting, diarrhea, bright red blood per rectum, melena, or hematemesis Neurologic: negative for visual changes, syncope, or dizziness All other systems reviewed and are otherwise negative except as noted above.    Blood pressure 124/90, pulse 60, height 5\' 10"  (1.778 m), weight 137 lb (62.1 kg).  General appearance: alert and no distress Neck: no adenopathy, no carotid bruit, no JVD, supple, symmetrical, trachea midline and thyroid not enlarged, symmetric, no tenderness/mass/nodules Lungs: clear  to auscultation bilaterally Heart: regular rate and rhythm, S1, S2 normal, no murmur, click, rub or gallop Extremities: extremities normal, atraumatic, no cyanosis or edema Pulses: Diminished left common femoral pedal pulse Skin: Skin color, texture, turgor normal. No rashes or lesions Neurologic: Alert and oriented X 3, normal strength and tone. Normal symmetric reflexes. Normal coordination and gait  EKG sinus rhythm at 60 with Brugada type ST segment changes followed by Dr. .  Personally reviewed this EKG.  ASSESSMENT AND PLAN:   Peripheral vascular disease, unspecified [facility]) Mr. Shearn was referred to me by Dr. Kizzie Bane for symptomatic PAD.  He does complain of left lower extremity claudication.  Dopplers performed 05/21/2020 revealed a right ABI of 1.17 and a left of 0.87.  He had a high-frequency signal in his mid left common iliac artery.  He has a diminished left common femoral pulse with a high-frequency bruit.  He wishes to proceed with angiography potential endovascular therapy for lifestyle limiting claudication.      05/23/2020 MD FACP,FACC,FAHA, [facility] 07/30/2020 10:59 AM

## 2020-07-31 ENCOUNTER — Other Ambulatory Visit: Payer: Self-pay

## 2020-07-31 DIAGNOSIS — I739 Peripheral vascular disease, unspecified: Secondary | ICD-10-CM

## 2020-07-31 MED ORDER — SODIUM CHLORIDE 0.9% FLUSH: 3.0000 mL | Freq: Two times a day (BID) | INTRAVENOUS | Status: DC

## 2020-08-13 LAB — CBC
Hematocrit: 44.1 % (ref 37.5–51.0)
Hemoglobin: 14.6 g/dL (ref 13.0–17.7)
MCH: 32.1 pg (ref 26.6–33.0)
MCHC: 33.1 g/dL (ref 31.5–35.7)
MCV: 97 fL (ref 79–97)
Platelets: 167 x10E3/uL (ref 150–450)
RBC: 4.55 x10E6/uL (ref 4.14–5.80)
RDW: 13 % (ref 11.6–15.4)
WBC: 7.8 x10E3/uL (ref 3.4–10.8)

## 2020-08-13 LAB — BASIC METABOLIC PANEL
BUN/Creatinine Ratio: 14 (ref 9–20)
BUN: 14 mg/dL (ref 6–24)
CO2: 27 mmol/L (ref 20–29)
Calcium: 9.6 mg/dL (ref 8.7–10.2)
Chloride: 102 mmol/L (ref 96–106)
Creatinine, Ser: 0.98 mg/dL (ref 0.76–1.27)
GFR calc Af Amer: 98 mL/min/{1.73_m2} (ref >59)
GFR calc non Af Amer: 85 mL/min/{1.73_m2} (ref >59)
Glucose: 78 mg/dL (ref 65–99)
Potassium: 4.6 mmol/L (ref 3.5–5.2)
Sodium: 141 mmol/L (ref 134–144)

## 2020-08-29 ENCOUNTER — Telehealth: Payer: Self-pay | Admitting: *Deleted

## 2020-08-29 NOTE — Telephone Encounter (Signed)
Pt contacted pre-abdominal aortogram  scheduled at [facility] for: Monday September 02, 2020 7:30 AM Verified arrival time and place: [facility] Main Entrance A [facility]) at: 5:30 AM   No solid food after midnight prior to cath, clear liquids until 5 AM day of procedure.  AM meds can be  taken pre-cath with sips of water including: ASA 81 mg   Confirmed patient has responsible adult to drive home post procedure and be with patient first 24 hours after arriving home:  You are allowed ONE visitor in the waiting room during the time you are at the hospital for your procedure. Both you and your visitor must wear a mask once you enter the hospital.    Left detailed message at phone number listed (DPR) with procedure instructions, call office if any questions.

## 2020-08-31 ENCOUNTER — Other Ambulatory Visit (HOSPITAL_COMMUNITY)
Admission: RE | Admit: 2020-08-31 | Discharge: 2020-08-31 | Disposition: A | Discharge status: Home / Self Care | Payer: Self-pay | Source: Ambulatory Visit | Attending: Cardiovascular Disease | Admitting: Cardiovascular Disease

## 2020-08-31 DIAGNOSIS — Z01812 Encounter for preprocedural laboratory examination: Secondary | ICD-10-CM | POA: Insufficient documentation

## 2020-08-31 DIAGNOSIS — Z20822 Contact with and (suspected) exposure to covid-19: Secondary | ICD-10-CM | POA: Insufficient documentation

## 2020-09-01 LAB — SARS CORONAVIRUS 2 (TAT 6-24 HRS): SARS Coronavirus 2: NEGATIVE

## 2020-09-02 ENCOUNTER — Ambulatory Visit (HOSPITAL_COMMUNITY)
Admission: RE | Admit: 2020-09-02 | Discharge: 2020-09-02 | Disposition: A | Discharge status: Home / Self Care | Payer: Self-pay | Attending: Cardiovascular Disease | Admitting: Cardiovascular Disease

## 2020-09-02 ENCOUNTER — Encounter (HOSPITAL_COMMUNITY)
Admission: RE | Disposition: A | Discharge status: Home / Self Care | Payer: Self-pay | Source: Home / Self Care | Attending: Cardiovascular Disease

## 2020-09-02 ENCOUNTER — Encounter (HOSPITAL_COMMUNITY): Payer: Self-pay | Admitting: Cardiovascular Disease

## 2020-09-02 ENCOUNTER — Other Ambulatory Visit: Payer: Self-pay

## 2020-09-02 DIAGNOSIS — I70212 Atherosclerosis of native arteries of extremities with intermittent claudication, left leg: Secondary | ICD-10-CM | POA: Insufficient documentation

## 2020-09-02 DIAGNOSIS — Z79899 Other long term (current) drug therapy: Secondary | ICD-10-CM | POA: Insufficient documentation

## 2020-09-02 DIAGNOSIS — F17210 Nicotine dependence, cigarettes, uncomplicated: Secondary | ICD-10-CM | POA: Insufficient documentation

## 2020-09-02 DIAGNOSIS — I739 Peripheral vascular disease, unspecified: Secondary | ICD-10-CM | POA: Diagnosis present

## 2020-09-02 HISTORY — PX: ABDOMINAL AORTOGRAM W/LOWER EXTREMITY: CATH118223

## 2020-09-02 HISTORY — PX: PERIPHERAL VASCULAR ATHERECTOMY: CATH118256

## 2020-09-02 HISTORY — PX: PERIPHERAL VASCULAR INTERVENTION: CATH118257

## 2020-09-02 LAB — POCT ACTIVATED CLOTTING TIME
Activated Clotting Time: 333 s
Activated Clotting Time: 267 s

## 2020-09-02 SURGERY — ABDOMINAL AORTOGRAM W/LOWER EXTREMITY
Anesthesia: LOCAL | Laterality: Left

## 2020-09-02 MED ORDER — LABETALOL HCL 5 MG/ML IV SOLN: 10.0000 mg | INTRAVENOUS | Status: DC | PRN

## 2020-09-02 MED ORDER — SODIUM CHLORIDE 0.9 % WEIGHT BASED INFUSION
3.0000 mL/kg/h | INTRAVENOUS | Status: AC
  Administered 2020-09-02: 3 mL/kg/h via INTRAVENOUS

## 2020-09-02 MED ORDER — NITROGLYCERIN IN D5W 200-5 MCG/ML-% IV SOLN
INTRAVENOUS | Status: AC
  Filled 2020-09-02: qty 250

## 2020-09-02 MED ORDER — FENTANYL CITRATE (PF) 100 MCG/2ML IJ SOLN
INTRAMUSCULAR | Status: AC
  Filled 2020-09-02: qty 2

## 2020-09-02 MED ORDER — CLOPIDOGREL BISULFATE 75 MG PO TABS: 75.0000 mg | ORAL_TABLET | Freq: Every day | ORAL | Status: DC

## 2020-09-02 MED ORDER — SODIUM CHLORIDE 0.9% FLUSH: 3.0000 mL | INTRAVENOUS | Status: DC | PRN

## 2020-09-02 MED ORDER — ATORVASTATIN CALCIUM 40 MG PO TABS
40.0000 mg | ORAL_TABLET | Freq: Every day | ORAL | Status: DC
Start: 2020-09-02 — End: 2020-09-02

## 2020-09-02 MED ORDER — HYDRALAZINE HCL 20 MG/ML IJ SOLN: 5.0000 mg | INTRAMUSCULAR | Status: DC | PRN

## 2020-09-02 MED ORDER — SODIUM CHLORIDE 0.9 % IV SOLN: INTRAVENOUS | Status: DC

## 2020-09-02 MED ORDER — HEPARIN SODIUM (PORCINE) 1000 UNIT/ML IJ SOLN
INTRAMUSCULAR | Status: DC | PRN
  Administered 2020-09-02: 7000 [IU] via INTRAVENOUS

## 2020-09-02 MED ORDER — CLOPIDOGREL BISULFATE 300 MG PO TABS
ORAL_TABLET | ORAL | Status: AC
  Filled 2020-09-02: qty 1

## 2020-09-02 MED ORDER — MIDAZOLAM HCL 2 MG/2ML IJ SOLN
INTRAMUSCULAR | Status: AC
  Filled 2020-09-02: qty 2

## 2020-09-02 MED ORDER — SODIUM CHLORIDE 0.9% FLUSH: 3.0000 mL | Freq: Two times a day (BID) | INTRAVENOUS | Status: DC

## 2020-09-02 MED ORDER — VERAPAMIL HCL 2.5 MG/ML IV SOLN
INTRAVENOUS | Status: AC
  Filled 2020-09-02: qty 2

## 2020-09-02 MED ORDER — HEPARIN SODIUM (PORCINE) 1000 UNIT/ML IJ SOLN
INTRAMUSCULAR | Status: AC
  Filled 2020-09-02: qty 1

## 2020-09-02 MED ORDER — SODIUM CHLORIDE 0.9 % WEIGHT BASED INFUSION: 1.0000 mL/kg/h | INTRAVENOUS | Status: DC

## 2020-09-02 MED ORDER — SODIUM CHLORIDE 0.9 % IV SOLN: 250.0000 mL | INTRAVENOUS | Status: DC | PRN

## 2020-09-02 MED ORDER — FENTANYL CITRATE (PF) 100 MCG/2ML IJ SOLN
INTRAMUSCULAR | Status: DC | PRN
  Administered 2020-09-02: 25 ug via INTRAVENOUS

## 2020-09-02 MED ORDER — CLOPIDOGREL BISULFATE 75 MG PO TABS
75.0000 mg | ORAL_TABLET | Freq: Every day | ORAL | 1 refills | Status: DC
  Filled 2020-12-10: qty 30, 30d supply, fill #0
  Filled 2021-01-13: qty 30, 30d supply, fill #1
  Filled 2021-02-27: qty 30, 30d supply, fill #2
  Filled 2021-04-02: qty 30, 30d supply, fill #3
  Filled 2021-05-20: qty 30, 30d supply, fill #4
  Filled 2021-07-04: qty 30, 30d supply, fill #5

## 2020-09-02 MED ORDER — MIDAZOLAM HCL 2 MG/2ML IJ SOLN
INTRAMUSCULAR | Status: DC | PRN
  Administered 2020-09-02: 1 mg via INTRAVENOUS

## 2020-09-02 MED ORDER — HEPARIN (PORCINE) IN NACL 1000-0.9 UT/500ML-% IV SOLN
INTRAVENOUS | Status: AC
  Filled 2020-09-02: qty 1500

## 2020-09-02 MED ORDER — HEPARIN (PORCINE) IN NACL 1000-0.9 UT/500ML-% IV SOLN
INTRAVENOUS | Status: DC | PRN
  Administered 2020-09-02: 500 mL

## 2020-09-02 MED ORDER — MORPHINE SULFATE (PF) 2 MG/ML IV SOLN: 2.0000 mg | INTRAVENOUS | Status: DC | PRN

## 2020-09-02 MED ORDER — LIDOCAINE HCL (PF) 1 % IJ SOLN
INTRAMUSCULAR | Status: DC | PRN
  Administered 2020-09-02: 30 mL

## 2020-09-02 MED ORDER — ACETAMINOPHEN 325 MG PO TABS
650.0000 mg | ORAL_TABLET | ORAL | Status: DC | PRN
  Administered 2020-09-02: 650 mg via ORAL
  Filled 2020-09-02: qty 2

## 2020-09-02 MED ORDER — ASPIRIN 81 MG PO CHEW: 81.0000 mg | CHEWABLE_TABLET | ORAL | Status: DC

## 2020-09-02 MED ORDER — LIDOCAINE HCL (PF) 1 % IJ SOLN
INTRAMUSCULAR | Status: AC
  Filled 2020-09-02: qty 30

## 2020-09-02 MED ORDER — ONDANSETRON HCL 4 MG/2ML IJ SOLN: 4.0000 mg | Freq: Four times a day (QID) | INTRAMUSCULAR | Status: DC | PRN

## 2020-09-02 MED ORDER — VIPERSLIDE LUBRICANT OPTIME: TOPICAL | Status: DC | PRN

## 2020-09-02 MED ORDER — ASPIRIN EC 81 MG PO TBEC: 81.0000 mg | DELAYED_RELEASE_TABLET | Freq: Every day | ORAL | Status: DC

## 2020-09-02 MED ORDER — IODIXANOL 320 MG/ML IV SOLN
INTRAVENOUS | Status: DC | PRN
  Administered 2020-09-02: 160 mL via INTRA_ARTERIAL

## 2020-09-02 MED ORDER — CLOPIDOGREL BISULFATE 300 MG PO TABS
ORAL_TABLET | ORAL | Status: DC | PRN
  Administered 2020-09-02: 300 mg via ORAL

## 2020-09-02 MED FILL — ?CLOPIDOGREL 75MG TA: 75 | 30 days supply | Qty: 30 | Fill #0

## 2020-09-02 SURGICAL SUPPLY — 26 items
BALLN MUSTANG 4.0X40 75 (BALLOONS) ×3
BALLN MUSTANG 9X40X75 (BALLOONS) ×3
BALLOON MUSTANG 4.0X40 75 (BALLOONS) ×2 IMPLANT
BALLOON MUSTANG 9X40X75 (BALLOONS) ×2 IMPLANT
CATH ANGIO 5F BER2 65CM (CATHETERS) ×3 IMPLANT
CATH ANGIO 5F PIGTAIL 65CM (CATHETERS) ×3 IMPLANT
CATH STRAIGHT 5FR 65CM (CATHETERS) ×3 IMPLANT
CLOSURE PERCLOSE PROSTYLE (VASCULAR PRODUCTS) ×3 IMPLANT
DIAMONDBACK SOLID OAS 2.0MM (CATHETERS) ×3
GUIDEWIRE ANGLED .035X150CM (WIRE) ×3 IMPLANT
KIT ENCORE 26 ADVANTAGE (KITS) ×3 IMPLANT
KIT PV (KITS) ×3 IMPLANT
LUBRICANT VIPERSLIDE CORONARY (MISCELLANEOUS) ×3 IMPLANT
SHEATH BRITE TIP 7FR 35CM (SHEATH) ×3 IMPLANT
SHEATH PINNACLE 5F 10CM (SHEATH) ×3 IMPLANT
SHEATH PINNACLE 7F 10CM (SHEATH) ×3 IMPLANT
STENT VIABAHNBX 8X59X80 (Permanent Stent) ×3 IMPLANT
SYR MEDRAD MARK 7 150ML (SYRINGE) ×3 IMPLANT
SYSTEM DIMNDBCK SLD OAS 2.0MM (CATHETERS) ×2 IMPLANT
TAPE VIPERTRACK RADIOPAQ (MISCELLANEOUS) ×2 IMPLANT
TAPE VIPERTRACK RADIOPAQUE (MISCELLANEOUS) ×3
TRANSDUCER W/STOPCOCK (MISCELLANEOUS) ×3 IMPLANT
TRAY PV CATH (CUSTOM PROCEDURE TRAY) ×3 IMPLANT
WIRE HITORQ VERSACORE ST 145CM (WIRE) ×3 IMPLANT
WIRE VERSACORE LOC 115CM (WIRE) ×3 IMPLANT
WIRE VIPER ADVANCE .017X335CM (WIRE) ×6 IMPLANT

## 2020-09-02 NOTE — Discharge Instructions (Signed)
Angiogram, Care After This sheet gives you information about how to care for yourself after your procedure. Your health care provider may also give you more specific instructions. If you have problems or questions, contact your health care provider. What can I expect after the procedure? After the procedure, it is common to have:  Bruising and tenderness at the catheter insertion area.  A collection of blood (hematoma) at the insertion area. This may feel like a small lump under the skin at the insertion site. Follow these instructions at home: Insertion site care  Follow instructions from your health care provider about how to take care of your insertion site. Make sure you: ? Wash your hands with soap and water before and after you change your bandage (dressing). If soap and water are not available, use hand sanitizer. ? Change your dressing as told by your health care provider.  Do not take baths, swim, or use a hot tub until your health care provider approves.  You may shower 24-48 hours after the procedure, or as told by your health care provider. To clean the insertion site: ? Gently wash the area with plain soap and water. ? Pat the area dry with a clean towel. ? Do not rub the site. This may cause bleeding.  Check your insertion site every day for signs of infection. Check for: ? Redness, swelling, or pain. ? Fluid or blood. ? Warmth. ? Pus or a bad smell.  Do not apply powder or lotion to the site. Keep the site clean and dry.   Activity  Do not drive for 24 hours if you were given a sedative during your procedure.  Rest as told by your health care provider, usually for 1-2 days.  Do not lift anything that is heavier than 10 lb (4.5 kg), or the limit that you are told, until your health care provider says that it is safe.  If the insertion site was in your leg, try to avoid stairs for a few days.  Return to your normal activities as told by your health care provider,  usually in about a week. Ask your health care provider what activities are safe for you. General instructions  If your insertion site starts bleeding, lie flat and put pressure on the site. If the bleeding does not stop, get help right away. This is a medical emergency.  Take over-the-counter and prescription medicines only as told by your health care provider.  Drink enough fluid to keep your urine pale yellow. This helps flush the contrast dye from your body.  Keep all follow-up visits as told by your health care provider. This is important.   Contact a health care provider if:  You have a fever or chills.  You have redness, swelling, or pain around your insertion site.  You have fluid or blood coming from your insertion site.  Your insertion site feels warm to the touch.  You have pus or a bad smell coming from your insertion site.  You have more bruising around the insertion site. Get help right away if you have:  A problem with the insertion area, such as: ? The area swells fast or bleeds even after you apply pressure. ? The area becomes pale, cool, tingly, or numb.  Chest pain.  Trouble breathing.  A rash.  Any symptoms of a stroke. "BE FAST" is an easy way to remember the main warning signs of a stroke: ? B - Balance. Signs are dizziness, sudden trouble walking,   or loss of balance. ? E - Eyes. Signs are trouble seeing or a sudden change in vision. ? F - Face. Signs are sudden weakness or loss of feeling of the face, or the face or eyelid drooping on one side. ? A - Arms. Signs are weakness or loss of feeling in an arm. This happens suddenly and usually on one side of the body. ? S - Speech. Signs are sudden trouble speaking, slurred speech, or trouble understanding what people say. ? T - Time. Time to call emergency services. Write down what time symptoms started.  You have other signs of a stroke, such as: ? A sudden, severe headache with no known cause. ? Nausea  or vomiting. ? Seizure. These symptoms may represent a serious problem that is an emergency. Do not wait to see if the symptoms will go away. Get medical help right away. Call your local emergency services (911 in the [place] Do not drive yourself to the hospital. Summary  It is common to have bruising and tenderness at the catheter insertion area.  Do not take baths, swim, or use a hot tub until your health care provider approves. You may shower 24-48 hours after the procedure or as told.  It is important to rest and drink plenty of fluids.  If the insertion site bleeds, lie flat and put pressure on the site. If the bleeding continues, get help right away. This is a medical emergency. This information is not intended to replace advice given to you by your health care provider. Make sure you discuss any questions you have with your health care provider. Document Revised: 06/14/2019 Document Reviewed: 06/14/2019 Elsevier Patient Education  2021 Elsevier Inc.  

## 2020-09-02 NOTE — Interval H&P Note (Signed)
History and Physical Interval Note:  09/02/2020 7:41 AM  Nicholas Espinoza  has presented today for surgery, with the diagnosis of PVD.  The various methods of treatment have been discussed with the patient and family. After consideration of risks, benefits and other options for treatment, the patient has consented to  Procedure(s): ABDOMINAL AORTOGRAM W/LOWER EXTREMITY (Bilateral) as a surgical intervention.  The patient's history has been reviewed, patient examined, no change in status, stable for surgery.  I have reviewed the patient's chart and labs.  Questions were answered to the patient's satisfaction.     Nanetta Batty

## 2020-09-02 NOTE — Hospital Course (Signed)
09/02/20 TOR TSUDA   Jul 08, 1961  [id number]   Primary Physician Claiborne Rigg, NP Primary Cardiologist: Runell Gess MD [name], [place]   HPI:  Nicholas Espinoza is a 60 y.o. 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  He was referred to me by Dr. Flora Lipps, his primary cardiologist, for evaluation of symptomatic PAD.  He does have a history of tobacco abuse currently smoking 1 to 2 cigarettes every several weeks down from a pack a week prior to that.  He has no history of diabetes, hypertension or hyperlipidemia.  Apparently his brother did die of a myocardial infarction.  Is never had a heart attack or stroke.  Denies chest pain or shortness of breath.  He said claudication of his left lower extremity for the last 6 months or so with Dopplers that revealed a right ABI of 1.17 and a left of 0.87 with what appears to be a high-frequency signal/hemodynamically significant lesion in the mid left common iliac artery.     Active Medications      Current Meds  Medication Sig   gabapentin (NEURONTIN) 300 MG capsule Take 1 capsule (300 mg total) by mouth 3 (three) times daily.        No Known Allergies   Social History         Socioeconomic History   Marital status: Single      Spouse name: Not on file   Number of children: 4   Years of education: Not on file   Highest education level: Not on file  Occupational History   Occupation: unemployed  Tobacco Use   Smoking status: Current Some Day Smoker      Packs/day: 25.00      Years: 0.50      Pack years: 12.50   Smokeless tobacco: Never Used   Tobacco comment: less then 1 cigarette daily  Vaping Use   Vaping Use: Never used  Substance and Sexual Activity   Alcohol use: Yes   Drug use: Yes      Types: Marijuana   Sexual activity: Yes  Other Topics Concern   Not on file  Social History Narrative   Not on file     Social Determinants of Health       Financial Resource Strain:    Difficulty of Paying Living Expenses: Not on file  Food Insecurity:    Worried About Running Out of Food in the Last Year: Not on file   Ran Out of Food in the Last Year: Not on file  Transportation Needs:    Lack of Transportation (Medical): Not on file   Lack of Transportation (Non-Medical): Not on file  Physical Activity:    Days of Exercise per Week: Not on file   Minutes of Exercise per Session: Not on file  Stress:    Feeling of Stress : Not on file  Social Connections:    Frequency of Communication with Friends and Family: Not on file   Frequency of Social Gatherings with Friends and Family: Not on file   Attends Religious Services: Not on file   Active Member of Clubs or Organizations: Not on file   Attends Banker Meetings: Not on file   Marital Status: Not on file  Intimate Partner Violence:    Fear of Current or Ex-Partner: Not on file  Emotionally Abused: Not on file   Physically Abused: Not on file   Sexually Abused: Not on file      Review of Systems: General: negative for chills, fever, night sweats or weight changes.  Cardiovascular: negative for chest pain, dyspnea on exertion, edema, orthopnea, palpitations, paroxysmal nocturnal dyspnea or shortness of breath Dermatological: negative for rash Respiratory: negative for cough or wheezing Urologic: negative for hematuria Abdominal: negative for nausea, vomiting, diarrhea, bright red blood per rectum, melena, or hematemesis Neurologic: negative for visual changes, syncope, or dizziness All other systems reviewed and are otherwise negative except as noted above.       Blood pressure 124/90, pulse 60, height 5\' 10"  (1.778 m), weight 137 lb (62.1 kg).  General appearance: alert and no distress Neck: no adenopathy, no carotid bruit, no JVD, supple, symmetrical, trachea midline and thyroid not enlarged, symmetric, no  tenderness/mass/nodules Lungs: clear to auscultation bilaterally Heart: regular rate and rhythm, S1, S2 normal, no murmur, click, rub or gallop Extremities: extremities normal, atraumatic, no cyanosis or edema Pulses: Diminished left common femoral pedal pulse Skin: Skin color, texture, turgor normal. No rashes or lesions Neurologic: Alert and oriented X 3, normal strength and tone. Normal symmetric reflexes. Normal coordination and gait   EKG sinus rhythm at 60 with Brugada type ST segment changes followed by Dr. .  Personally reviewed this EKG.   ASSESSMENT AND PLAN:    Peripheral vascular disease, unspecified [facility]) Mr. Brinkmeier was referred to me by Dr. Kizzie Bane for symptomatic PAD.  He does complain of left lower extremity claudication.  Dopplers performed 05/21/2020 revealed a right ABI of 1.17 and a left of 0.87.  He had a high-frequency signal in his mid left common iliac artery.  He has a diminished left common femoral pulse with a high-frequency bruit.  He wishes to proceed with angiography potential endovascular therapy for lifestyle limiting claudication.      05/23/2020, M.D., FACP, [facility], [facility] [address] [place], [place]  [place]  [phone] 09/02/2020 6:35 AM

## 2020-09-02 NOTE — H&P (Signed)
09/02/20 Nicholas Espinoza   [birth year]  [id number]  Primary Physician Claiborne Rigg, NP Primary Cardiologist: Runell Gess MD Nicholas Espinoza, [place]  HPI:  Nicholas Espinoza is a 60 y.o. 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  He was referred to me by Dr. Flora Lipps, his primary cardiologist, for evaluation of symptomatic PAD.  He does have a history of tobacco abuse currently smoking 1 to 2 cigarettes every several weeks down from a pack a week prior to that.  He has no history of diabetes, hypertension or hyperlipidemia.  Apparently his brother did die of a myocardial infarction.  Is never had a heart attack or stroke.  Denies chest pain or shortness of breath.  He said claudication of his left lower extremity for the last 6 months or so with Dopplers that revealed a right ABI of 1.17 and a left of 0.87 with what appears to be a high-frequency signal/hemodynamically significant lesion in the mid left common iliac artery.   Active Medications      Current Meds  Medication Sig  . gabapentin (NEURONTIN) 300 MG capsule Take 1 capsule (300 mg total) by mouth 3 (three) times daily.       No Known Allergies  Social History        Socioeconomic History  . Marital status: Single    Spouse name: Not on file  . Number of children: 4  . Years of education: Not on file  . Highest education level: Not on file  Occupational History  . Occupation: unemployed  Tobacco Use  . Smoking status: Current Some Day Smoker    Packs/day: 25.00    Years: 0.50    Pack years: 12.50  . Smokeless tobacco: Never Used  . Tobacco comment: less then 1 cigarette daily  Vaping Use  . Vaping Use: Never used  Substance and Sexual Activity  . Alcohol use: Yes  . Drug use: Yes    Types: Marijuana  . Sexual activity: Yes  Other Topics Concern  . Not on file  Social History Narrative   . Not on file   Social Determinants of Health      Financial Resource Strain:   . Difficulty of Paying Living Expenses: Not on file  Food Insecurity:   . Worried About Programme researcher, broadcasting/film/video in the Last Year: Not on file  . Ran Out of Food in the Last Year: Not on file  Transportation Needs:   . Lack of Transportation (Medical): Not on file  . Lack of Transportation (Non-Medical): Not on file  Physical Activity:   . Days of Exercise per Week: Not on file  . Minutes of Exercise per Session: Not on file  Stress:   . Feeling of Stress : Not on file  Social Connections:   . Frequency of Communication with Friends and Family: Not on file  . Frequency of Social Gatherings with Friends and Family: Not on file  . Attends Religious Services: Not on file  . Active Member of Clubs or Organizations: Not on file  . Attends Banker Meetings: Not on file  . Marital Status: Not on file  Intimate Partner Violence:   . Fear of Current or Ex-Partner: Not on file  . Emotionally Abused: Not on file  . Physically Abused: Not on file  . Sexually Abused: Not on file  Review of Systems: General: negative for chills, fever, night sweats or weight changes.  Cardiovascular: negative for chest pain, dyspnea on exertion, edema, orthopnea, palpitations, paroxysmal nocturnal dyspnea or shortness of breath Dermatological: negative for rash Respiratory: negative for cough or wheezing Urologic: negative for hematuria Abdominal: negative for nausea, vomiting, diarrhea, bright red blood per rectum, melena, or hematemesis Neurologic: negative for visual changes, syncope, or dizziness All other systems reviewed and are otherwise negative except as noted above.    Blood pressure 124/90, pulse 60, height 5\' 10"  (1.778 m), weight 137 lb (62.1 kg).  General appearance: alert and no distress Neck: no adenopathy, no carotid bruit, no JVD, supple, symmetrical, trachea midline and thyroid not  enlarged, symmetric, no tenderness/mass/nodules Lungs: clear to auscultation bilaterally Heart: regular rate and rhythm, S1, S2 normal, no murmur, click, rub or gallop Extremities: extremities normal, atraumatic, no cyanosis or edema Pulses: Diminished left common femoral pedal pulse Skin: Skin color, texture, turgor normal. No rashes or lesions Neurologic: Alert and oriented X 3, normal strength and tone. Normal symmetric reflexes. Normal coordination and gait  EKG sinus rhythm at 60 with Brugada type ST segment changes followed by Dr. .  Personally reviewed this EKG.  ASSESSMENT AND PLAN:   Peripheral vascular disease, unspecified [facility]) Mr. Mcbee was referred to me by Dr. Kizzie Bane for symptomatic PAD.  He does complain of left lower extremity claudication.  Dopplers performed 05/21/2020 revealed a right ABI of 1.17 and a left of 0.87.  He had a high-frequency signal in his mid left common iliac artery.  He has a diminished left common femoral pulse with a high-frequency bruit.  He wishes to proceed with angiography potential endovascular therapy for lifestyle limiting claudication.    05/23/2020, M.D., FACP, [facility], [facility] [address] [place], [place]  [place]  [phone] 09/02/2020 7:29 AM

## 2020-09-09 ENCOUNTER — Ambulatory Visit (HOSPITAL_COMMUNITY): Payer: Self-pay

## 2020-09-16 ENCOUNTER — Ambulatory Visit (HOSPITAL_COMMUNITY)
Admission: RE | Admit: 2020-09-16 | Discharge: 2020-09-16 | Disposition: A | Discharge status: Home / Self Care | Payer: Self-pay | Source: Ambulatory Visit | Attending: Cardiology | Admitting: Cardiology

## 2020-09-16 ENCOUNTER — Other Ambulatory Visit: Payer: Self-pay

## 2020-09-16 ENCOUNTER — Other Ambulatory Visit: Payer: Self-pay | Admitting: Cardiovascular Disease

## 2020-09-16 DIAGNOSIS — I739 Peripheral vascular disease, unspecified: Secondary | ICD-10-CM

## 2020-09-16 DIAGNOSIS — Z95828 Presence of other vascular implants and grafts: Secondary | ICD-10-CM

## 2020-09-20 ENCOUNTER — Ambulatory Visit: Payer: Self-pay | Admitting: Cardiovascular Disease

## 2020-10-02 MED FILL — ?CLOPIDOGREL 75 MG TABL: 75 | 30 days supply | Qty: 30 | Fill #1

## 2020-10-29 ENCOUNTER — Telehealth: Payer: Self-pay

## 2020-10-29 ENCOUNTER — Ambulatory Visit: Payer: Self-pay | Admitting: Cardiovascular Disease

## 2020-10-29 MED FILL — GABAPENTIN 300 MG CAPSULE: 300 | 30 days supply | Qty: 90 | Fill #2

## 2020-10-29 MED FILL — ?CLOPIDOGREL 75MG TABL: 75 | 30 days supply | Qty: 30 | Fill #2

## 2020-10-29 NOTE — Telephone Encounter (Signed)
Left message for pt to call back  °

## 2020-10-30 ENCOUNTER — Telehealth: Payer: Self-pay

## 2020-10-30 NOTE — Telephone Encounter (Signed)
Left message for pt to call back regarding changing schuduled appointment for 3/23.

## 2020-11-06 NOTE — Progress Notes (Unsigned)
Cardiology Office Note:   Date:  11/07/2020  NAME:  Nicholas Espinoza    MRN: [id number] DOB:  13-Sep-1960   PCP:  Claiborne Rigg, NP  Cardiologist:  No primary care provider on file.  Electrophysiologist:  None    Referring MD: Claiborne Rigg, NP   Chief Complaint  Patient presents with  . Follow-up    6 months.   History of Present Illness:   Nicholas Espinoza is a 60 y.o. male with a hx of CAD, PAD s/p procedure, HLD, tobacco abuse, brugada pattern (no symptoms) who presents for follow-up.  He is doing well after his recent peripheral intervention.  On aspirin and Plavix without any bleeding issues.  Still smoking 1 or 2 cigarettes/day.  Smoking cessation was advised.  His blood pressure is well controlled.  He has had no syncopal episodes.  He seems to be doing well.  We do need to start him on a statin agent.  He is okay to start Crestor today.  Denies any symptoms of chest pain, shortness of breath.  His leg pain has improved greatly since stenting with Dr. Gery Pray.  Plans to follow with Dr. Gery Pray for repeat evaluation and follow-up ultrasounds.  We also went over the results of his coronary CTA.  Had minimal CAD in the LAD.  Coronary calcium score in the 63rd percentile.  Goal LDL cholesterol is less than 70.  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 Tobacco abuse  3. PAD -90% mid left common iliac stenosis -> stenting 09/02/2020 4. Non-obstructive CAD -CAC score 53 (63rd percentile) -<25% LAD 5. HLD -T chol 169, HDL 54, LDL 100, TG 79  Past Medical History: Past Medical History:  Diagnosis Date  . Left ventricular diastolic dysfunction, NYHA class 1     Past Surgical History: Past Surgical History:  Procedure Laterality Date  . ABDOMINAL AORTOGRAM W/LOWER EXTREMITY Bilateral 09/02/2020   Procedure: ABDOMINAL AORTOGRAM W/LOWER EXTREMITY;  Surgeon: Runell Gess, MD;  Location: [facility] INVASIVE CV LAB;  Service: Cardiovascular;  Laterality:  Bilateral;  . DENTAL SURGERY    . PERIPHERAL VASCULAR ATHERECTOMY Left 09/02/2020   Procedure: PERIPHERAL VASCULAR ATHERECTOMY;  Surgeon: Runell Gess, MD;  Location: [facility] INVASIVE CV LAB;  Service: Cardiovascular;  Laterality: Left;  . PERIPHERAL VASCULAR INTERVENTION Left 09/02/2020   Procedure: PERIPHERAL VASCULAR INTERVENTION;  Surgeon: Runell Gess, MD;  Location: [facility] INVASIVE CV LAB;  Service: Cardiovascular;  Laterality: Left;    Current Medications: Current Meds  Medication Sig  . aspirin EC 81 MG tablet Take 81 mg by mouth daily. Swallow whole.  . clopidogrel (PLAVIX) 75 MG tablet Take 1 tablet (75 mg total) by mouth daily with breakfast.  . gabapentin (NEURONTIN) 300 MG capsule Take 1 capsule (300 mg total) by mouth 3 (three) times daily.  . rosuvastatin (CRESTOR) 20 MG tablet Take 1 tablet (20 mg total) by mouth daily.   Current Facility-Administered Medications for the 11/07/20 encounter (Office Visit) with Sande Rives, MD  Medication  . sodium chloride flush (NS) 0.9 % injection 3 mL     Allergies:    Patient has no known allergies.   Social History: Social History   Socioeconomic History  . Marital status: Single    Spouse name: Not on file  . Number of children: 4  . Years of education: Not on file  . Highest education level: Not on file  Occupational History  . Occupation: unemployed  Tobacco Use  . Smoking status: Current Some Day Smoker    Packs/day: 25.00    Years: 0.50    Pack years: 12.50  . Smokeless tobacco: Never Used  . Tobacco comment: less then 1 cigarette daily  Vaping Use  . Vaping Use: Never used  Substance and Sexual Activity  . Alcohol use: Yes  . Drug use: Yes    Types: Marijuana  . Sexual activity: Yes  Other Topics Concern  . Not on file  Social History Narrative  . Not on file   Social Determinants of Health   Financial Resource Strain: Not on file  Food Insecurity: Not on file  Transportation Needs: Not on  file  Physical Activity: Not on file  Stress: Not on file  Social Connections: Not on file     Family History: The patient's family history includes Cancer - Other in his father; Diabetes in his sister and sister; Heart attack in his mother; Heart attack (age of onset: 39) in his brother.  ROS:   All other ROS reviewed and negative. Pertinent positives noted in the HPI.     EKGs/Labs/Other Studies Reviewed:   The following studies were personally reviewed by me today:  Recent Labs: 01/15/2020: B Natriuretic Peptide 35.3 02/12/2020: TSH 1.290 08/12/2020: BUN 14; Creatinine, Ser 0.98; Hemoglobin 14.6; Platelets 167; Potassium 4.6; Sodium 141   Recent Lipid Panel    Component Value Date/Time   CHOL 169 05/01/2020 0951   TRIG 79 05/01/2020 0951   HDL 54 05/01/2020 0951   CHOLHDL 3.1 05/01/2020 0951   LDLCALC 100 (H) 05/01/2020 0951    Physical Exam:   VS:  BP 92/70 (BP Location: Left Arm, Patient Position: Sitting, Cuff Size: Normal)   Pulse 84   Ht 5\' 10"  (1.778 m)   Wt 127 lb (57.6 kg)   BMI 18.22 kg/m    Wt Readings from Last 3 Encounters:  11/07/20 127 lb (57.6 kg)  09/02/20 134 lb (60.8 kg)  07/30/20 137 lb (62.1 kg)    General: Well nourished, well developed, in no acute distress Head: Atraumatic, normal size  Eyes: PEERLA, EOMI  Neck: Supple, no JVD Endocrine: No thryomegaly Cardiac: Normal S1, S2; RRR; no murmurs, rubs, or gallops Lungs: Clear to auscultation bilaterally, no wheezing, rhonchi or rales  Abd: Soft, nontender, no hepatomegaly  Ext: No edema, pulses 2+ Musculoskeletal: No deformities, BUE and BLE strength normal and equal Skin: Warm and dry, no rashes   Neuro: Alert and oriented to person, place, time, and situation, CNII-XII grossly intact, no focal deficits  Psych: Normal mood and affect   ASSESSMENT:   Nicholas Espinoza is a 60 y.o. male who presents for the following: 1. Peripheral vascular disease, unspecified (HCC)   2. Coronary artery  disease involving native coronary artery of native heart without angina pectoris   3. Agatston coronary artery calcium score less than 100   4. Mixed hyperlipidemia   5. Tobacco abuse     PLAN:   1. Peripheral vascular disease, unspecified (HCC) -Recent peripheral intervention in the mid left common iliac artery.  He will continue DAPT.  He will follow with Dr. 41 for further recommendation. -Starting statin medication.  2. Coronary artery disease involving native coronary artery of native heart without angina pectoris -Minimal plaque in the LAD.  Coronary calcium score in the 63rd percentile.  No symptoms of angina.  Normal left ventricular function.  Continue aspirin and statin therapy.  Crestor 20 mg daily was added  today.  His most recent LDL cholesterol is 100.  Goal LDL is less than 70.  3. Agatston coronary artery calcium score less than 100 -See problem 2  4. Mixed hyperlipidemia -See problem to  5. Tobacco abuse -Still smoking.  Smoking cessation was advised.  6. Brugada Pattern, Type 1 -Recent evaluation by electrophysiology for Brugada pattern type I.  He has no syncope or concerning symptoms.  He does not have a syndrome.  No ICD is needed.  Continue to monitor.  No need for further evaluation at this time.  Disposition: Return in about 1 year (around 11/07/2021).  Medication Adjustments/Labs and Tests Ordered: Current medicines are reviewed at length with the patient today.  Concerns regarding medicines are outlined above.  No orders of the defined types were placed in this encounter.  Meds ordered this encounter  Medications  . rosuvastatin (CRESTOR) 20 MG tablet    Sig: Take 1 tablet (20 mg total) by mouth daily.    Dispense:  90 tablet    Refill:  3    Patient Instructions  Medication Instructions:  START rosuvastatin (Crestor) 20 mg daily  *If you need a refill on your cardiac medications before your next appointment, please call your  pharmacy*  Follow-Up: At [facility], you and your health needs are our priority.  As part of our continuing mission to provide you with exceptional heart care, we have created designated Provider Care Teams.  These Care Teams include your primary Cardiologist (physician) and Advanced Practice Providers (APPs -  Physician Assistants and Nurse Practitioners) who all work together to provide you with the care you need, when you need it.  We recommend signing up for the patient portal called "MyChart".  Sign up information is provided on this After Visit Summary.  MyChart is used to connect with patients for Virtual Visits (Telemedicine).  Patients are able to view lab/test results, encounter notes, upcoming appointments, etc.  Non-urgent messages can be sent to your provider as well.   To learn more about what you can do with MyChart, go to [email or web address].    Your next appointment:   1 year(s)  The format for your next appointment:   In Person  Provider:   Lennie Odor, MD  Other Instructions APPOINTMENT WITH Dr. Allyson Sabal      Time Spent with Patient: I have spent a total of 25 minutes with patient reviewing hospital notes, telemetry, EKGs, labs and examining the patient as well as establishing an assessment and plan that was discussed with the patient.  > 50% of time was spent in direct patient care.  Signed, Lenna Gilford. Flora Lipps, MD, [facility]  [facility]  [address] [place], [place] [address] [phone]  11/07/2020 4:44 PM

## 2020-11-07 ENCOUNTER — Other Ambulatory Visit: Payer: Self-pay

## 2020-11-07 ENCOUNTER — Encounter: Payer: Self-pay | Admitting: Cardiovascular Disease

## 2020-11-07 ENCOUNTER — Ambulatory Visit (INDEPENDENT_AMBULATORY_CARE_PROVIDER_SITE_OTHER): Payer: Self-pay | Admitting: Cardiovascular Disease

## 2020-11-07 VITALS — BP 92/70 | HR 84 | Ht 70.0 in | Wt 127.0 lb

## 2020-11-07 DIAGNOSIS — I739 Peripheral vascular disease, unspecified: Secondary | ICD-10-CM

## 2020-11-07 DIAGNOSIS — Z720 Tobacco use: Secondary | ICD-10-CM

## 2020-11-07 DIAGNOSIS — E782 Mixed hyperlipidemia: Secondary | ICD-10-CM

## 2020-11-07 DIAGNOSIS — R931 Abnormal findings on diagnostic imaging of heart and coronary circulation: Secondary | ICD-10-CM

## 2020-11-07 DIAGNOSIS — I2510 Atherosclerotic heart disease of native coronary artery without angina pectoris: Secondary | ICD-10-CM

## 2020-11-07 MED ORDER — ROSUVASTATIN CALCIUM 20 MG PO TABS
20.0000 mg | ORAL_TABLET | Freq: Every day | ORAL | 3 refills | Status: DC
  Filled 2021-08-14 – 2021-09-25 (×2): qty 30, 30d supply, fill #0
  Filled 2021-09-25 – 2021-10-30 (×2): qty 30, 30d supply, fill #1

## 2020-11-07 NOTE — Patient Instructions (Signed)
Medication Instructions:  START rosuvastatin (Crestor) 20 mg daily  *If you need a refill on your cardiac medications before your next appointment, please call your pharmacy*  Follow-Up: At [facility], you and your health needs are our priority.  As part of our continuing mission to provide you with exceptional heart care, we have created designated Provider Care Teams.  These Care Teams include your primary Cardiologist (physician) and Advanced Practice Providers (APPs -  Physician Assistants and Nurse Practitioners) who all work together to provide you with the care you need, when you need it.  We recommend signing up for the patient portal called "MyChart".  Sign up information is provided on this After Visit Summary.  MyChart is used to connect with patients for Virtual Visits (Telemedicine).  Patients are able to view lab/test results, encounter notes, upcoming appointments, etc.  Non-urgent messages can be sent to your provider as well.   To learn more about what you can do with MyChart, go to [email or web address].    Your next appointment:   1 year(s)  The format for your next appointment:   In Person  Provider:   Lennie Odor, MD  Other Instructions APPOINTMENT WITH Dr. Allyson Sabal

## 2020-11-13 ENCOUNTER — Ambulatory Visit: Payer: Self-pay | Admitting: Cardiovascular Disease

## 2020-11-29 ENCOUNTER — Other Ambulatory Visit: Payer: Self-pay

## 2020-11-29 ENCOUNTER — Ambulatory Visit (INDEPENDENT_AMBULATORY_CARE_PROVIDER_SITE_OTHER): Payer: Self-pay | Admitting: Cardiovascular Disease

## 2020-11-29 ENCOUNTER — Encounter: Payer: Self-pay | Admitting: Cardiovascular Disease

## 2020-11-29 VITALS — BP 115/61 | HR 69 | Ht 70.0 in | Wt 131.0 lb

## 2020-11-29 DIAGNOSIS — I2510 Atherosclerotic heart disease of native coronary artery without angina pectoris: Secondary | ICD-10-CM

## 2020-11-29 DIAGNOSIS — E782 Mixed hyperlipidemia: Secondary | ICD-10-CM

## 2020-11-29 DIAGNOSIS — I739 Peripheral vascular disease, unspecified: Secondary | ICD-10-CM

## 2020-11-29 NOTE — Assessment & Plan Note (Signed)
History of PAD status post orbital Auchter atherectomy, PTA and covered stenting of a highly calcified subtotally occluded left common iliac artery stenosis 09/02/2020.  He had no disease on the right.  His follow-up Doppler showed marked improvement in his left lower extremity claudication has resolved.  Does complain of some pain in his right leg which is fairly constant and sounds neurogenic.

## 2020-11-29 NOTE — Progress Notes (Signed)
11/29/2020 [name]   11/24/1960  [id number]  Primary Physician Claiborne Rigg, NP Primary Cardiologist: Runell Gess MD [name], [place]  HPI:  Nicholas Espinoza is a 60 y.o.  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 He was referred to me by Dr. Flora Lipps, his primary cardiologist, for evaluation of symptomatic PAD.  I last saw him in the office 09/02/2020. He does have a history of tobacco abuse currently smoking 1 to 2 cigarettes every several weeks down from a pack a week prior to that. He has no history of diabetes, hypertension or hyperlipidemia. Apparently his brother did die of a myocardial infarction. Is never had a heart attack or stroke. Denies chest pain or shortness of breath. He said claudication of his left lower extremity for the last 6 months or so with Dopplers that revealed a right ABI of 1.17 and a left of 0.87 with what appears to be a high-frequency signal/hemodynamically significant lesion in the mid left common iliac artery.  I performed peripheral angiography on him 09/02/2020 revealing a highly calcified 90% left common iliac artery stenosis.  I performed orbital atherectomy, PTA and covered stenting using an 8 mm x 59 mm long VBX covered stent.  His postprocedure Doppler studies performed 09/16/2020 revealed normal left ABI with normal velocities.  His left lower extremity claudication has resolved.  He does have some right leg pain which sounds more neurogenic than anything else.   Current Meds  Medication Sig  . aspirin EC 81 MG tablet Take 81 mg by mouth daily. Swallow whole.  . clopidogrel (PLAVIX) 75 MG tablet Take 1 tablet (75 mg total) by mouth daily with breakfast.  . gabapentin (NEURONTIN) 300 MG capsule TAKE 1 CAPSULE (300 MG TOTAL) BY MOUTH 3 (THREE) TIMES DAILY.  . rosuvastatin (CRESTOR) 20 MG tablet Take 1 tablet (20 mg total) by mouth  daily.   Current Facility-Administered Medications for the 11/29/20 encounter (Office Visit) with Runell Gess, MD  Medication  . sodium chloride flush (NS) 0.9 % injection 3 mL     No Known Allergies  Social History   Socioeconomic History  . Marital status: Single    Spouse name: Not on file  . Number of children: 4  . Years of education: Not on file  . Highest education level: Not on file  Occupational History  . Occupation: unemployed  Tobacco Use  . Smoking status: Current Some Day Smoker    Packs/day: 25.00    Years: 0.50    Pack years: 12.50  . Smokeless tobacco: Never Used  . Tobacco comment: less then 1 cigarette daily  Vaping Use  . Vaping Use: Never used  Substance and Sexual Activity  . Alcohol use: Yes  . Drug use: Yes    Types: Marijuana  . Sexual activity: Yes  Other Topics Concern  . Not on file  Social History Narrative  . Not on file   Social Determinants of Health   Financial Resource Strain: Not on file  Food Insecurity: Not on file  Transportation Needs: Not on file  Physical Activity: Not on file  Stress: Not on file  Social Connections: Not on file  Intimate Partner Violence: Not on file     Review of Systems: General: negative for chills, fever, night sweats or weight changes.  Cardiovascular: negative for chest pain, dyspnea on exertion, edema,  orthopnea, palpitations, paroxysmal nocturnal dyspnea or shortness of breath Dermatological: negative for rash Respiratory: negative for cough or wheezing Urologic: negative for hematuria Abdominal: negative for nausea, vomiting, diarrhea, bright red blood per rectum, melena, or hematemesis Neurologic: negative for visual changes, syncope, or dizziness All other systems reviewed and are otherwise negative except as noted above.    Blood pressure 115/61, pulse 69, height 5\' 10"  (1.778 m), weight 131 lb (59.4 kg), SpO2 95 %.  General appearance: alert and no distress Neck: no adenopathy,  no carotid bruit, no JVD, supple, symmetrical, trachea midline and thyroid not enlarged, symmetric, no tenderness/mass/nodules Lungs: clear to auscultation bilaterally Heart: regular rate and rhythm, S1, S2 normal, no murmur, click, rub or gallop Extremities: extremities normal, atraumatic, no cyanosis or edema Pulses: 2+ and symmetric Skin: Skin color, texture, turgor normal. No rashes or lesions Neurologic: Alert and oriented X 3, normal strength and tone. Normal symmetric reflexes. Normal coordination and gait  EKG sinus rhythm at 69 with early repolarization pattern.  I personally reviewed this EKG.  ASSESSMENT AND PLAN:   Peripheral vascular disease, unspecified (HCC) History of PAD status post orbital Auchter atherectomy, PTA and covered stenting of a highly calcified subtotally occluded left common iliac artery stenosis 09/02/2020.  He had no disease on the right.  His follow-up Doppler showed marked improvement in his left lower extremity claudication has resolved.  Does complain of some pain in his right leg which is fairly constant and sounds neurogenic.      10/31/2020 MD FACP,FACC,FAHA, [facility] 11/29/2020 1:52 PM

## 2020-11-29 NOTE — Patient Instructions (Signed)
Medication Instructions:  Your physician recommends that you continue on your current medications as directed. Please refer to the Current Medication list given to you today.  *If you need a refill on your cardiac medications before your next appointment, please call your pharmacy*  Lab Work: Your physician recommends that you return for lab work in: next 1-2 weeks for fasting lipid/liver profile.   If you have labs (blood work) drawn today and your tests are completely normal, you will receive your results only by: Marland Kitchen MyChart Message (if you have MyChart) OR . A paper copy in the mail If you have any lab test that is abnormal or we need to change your treatment, we will call you to review the results.    Testing/Procedures: Your physician has requested that you have a lower extremity arterial duplex. This test is an ultrasound of the arteries in the legs. It looks at arterial blood flow in the legs. Allow one hour for Lower Arterial scans. There are no restrictions or special instructions  Your physician has requested that you have an ankle brachial index (ABI). During this test an ultrasound and blood pressure cuff are used to evaluate the arteries that supply the arms and legs with blood. Allow thirty minutes for this exam. There are no restrictions or special instructions.  These procedures are done at [address] [facility]. 2nd Floor. To be done Jan of 2023.   Follow-Up: At [facility], you and your health needs are our priority.  As part of our continuing mission to provide you with exceptional heart care, we have created designated Provider Care Teams.  These Care Teams include your primary Cardiologist (physician) and Advanced Practice Providers (APPs -  Physician Assistants and Nurse Practitioners) who all work together to provide you with the care you need, when you need it.  We recommend signing up for the patient portal called "MyChart".  Sign up information is provided on this  After Visit Summary.  MyChart is used to connect with patients for Virtual Visits (Telemedicine).  Patients are able to view lab/test results, encounter notes, upcoming appointments, etc.  Non-urgent messages can be sent to your provider as well.   To learn more about what you can do with MyChart, go to [email or web address].    Your next appointment:   No future appointments made at this time. We will see you on an as needed basis.  If you need an appointment please call our office.   Provider:   Nanetta Batty, MD

## 2020-12-05 ENCOUNTER — Other Ambulatory Visit: Payer: Self-pay

## 2020-12-05 ENCOUNTER — Ambulatory Visit: Payer: Self-pay | Admitting: Physician Assistant

## 2020-12-05 DIAGNOSIS — M5441 Lumbago with sciatica, right side: Secondary | ICD-10-CM

## 2020-12-05 DIAGNOSIS — I739 Peripheral vascular disease, unspecified: Secondary | ICD-10-CM

## 2020-12-05 MED ORDER — METHYLPREDNISOLONE ACETATE 80 MG/ML IJ SUSP
80.0000 mg | Freq: Once | INTRAMUSCULAR | Status: AC
  Administered 2020-12-05: 80 mg via INTRAMUSCULAR

## 2020-12-05 NOTE — Patient Instructions (Signed)
I encourage you to use Tylenol, icing, rest, gentle stretching to help with your pain.  It is very important that you avoid all NSAIDs, this includes ibuprofen, Aleve, Excedrin, Goody's powders etc.  I hope that you feel better soon, please let us know if there is anything else we can do for you  Roney Jaffe, PA-C Physician Assistant [facility] [email or web address]    Sciatica  Sciatica is pain, numbness, weakness, or tingling along the path of the sciatic nerve. The sciatic nerve starts in the lower back and runs down the back of each leg. The nerve controls the muscles in the lower leg and in the back of the knee. It also provides feeling (sensation) to the back of the thigh, the lower leg, and the sole of the foot. Sciatica is a symptom of another medical condition that pinches or puts pressure on the sciatic nerve. Sciatica most often only affects one side of the body. Sciatica usually goes away on its own or with treatment. In some cases, sciatica may come back (recur). What are the causes? This condition is caused by pressure on the sciatic nerve or pinching of the nerve. This may be the result of:  A disk in between the bones of the spine bulging out too far (herniated disk).  Age-related changes in the spinal disks.  A pain disorder that affects a muscle in the buttock.  Extra bone growth near the sciatic nerve.  A break (fracture) of the pelvis.  Pregnancy.  Tumor. This is rare. What increases the risk? The following factors may make you more likely to develop this condition:  Playing sports that place pressure or stress on the spine.  Having poor strength and flexibility.  A history of back injury or surgery.  Sitting for long periods of time.  Doing activities that involve repetitive bending or lifting.  Obesity. What are the signs or symptoms? Symptoms can vary from mild to very severe, and they may  include:  Any of these problems in the lower back, leg, hip, or buttock: ? Mild tingling, numbness, or dull aches. ? Burning sensations. ? Sharp pains.  Numbness in the back of the calf or the sole of the foot.  Leg weakness.  Severe back pain that makes movement difficult. Symptoms may get worse when you cough, sneeze, or laugh, or when you sit or stand for long periods of time. How is this diagnosed? This condition may be diagnosed based on:  Your symptoms and medical history.  A physical exam.  Blood tests.  Imaging tests, such as: ? X-rays. ? MRI. ? CT scan. How is this treated? In many cases, this condition improves on its own without treatment. However, treatment may include:  Reducing or modifying physical activity.  Exercising and stretching.  Icing and applying heat to the affected area.  Medicines that help to: ? Relieve pain and swelling. ? Relax your muscles.  Injections of medicines that help to relieve pain, irritation, and inflammation around the sciatic nerve (steroids).  Surgery. Follow these instructions at home: Medicines  Take over-the-counter and prescription medicines only as told by your health care provider.  Ask your health care provider if the medicine prescribed to you: ? Requires you to avoid driving or using heavy machinery. ? Can cause constipation. You may need to take these actions to prevent or treat constipation:  Drink enough fluid to keep your urine pale yellow.  Take over-the-counter or prescription medicines.  Eat foods that are  high in fiber, such as beans, whole grains, and fresh fruits and vegetables.  Limit foods that are high in fat and processed sugars, such as fried or sweet foods. Managing pain  If directed, put ice on the affected area. ? Put ice in a plastic bag. ? Place a towel between your skin and the bag. ? Leave the ice on for 20 minutes, 2-3 times a day.  If directed, apply heat to the affected  area. Use the heat source that your health care provider recommends, such as a moist heat pack or a heating pad. ? Place a towel between your skin and the heat source. ? Leave the heat on for 20-30 minutes. ? Remove the heat if your skin turns bright red. This is especially important if you are unable to feel pain, heat, or cold. You may have a greater risk of getting burned.      Activity  Return to your normal activities as told by your health care provider. Ask your health care provider what activities are safe for you.  Avoid activities that make your symptoms worse.  Take brief periods of rest throughout the day. ? When you rest for longer periods, mix in some mild activity or stretching between periods of rest. This will help to prevent stiffness and pain. ? Avoid sitting for long periods of time without moving. Get up and move around at least one time each hour.  Exercise and stretch regularly, as told by your health care provider.  Do not lift anything that is heavier than 10 lb (4.5 kg) while you have symptoms of sciatica. When you do not have symptoms, you should still avoid heavy lifting, especially repetitive heavy lifting.  When you lift objects, always use proper lifting technique, which includes: ? Bending your knees. ? Keeping the load close to your body. ? Avoiding twisting.   General instructions  Maintain a healthy weight. Excess weight puts extra stress on your back.  Wear supportive, comfortable shoes. Avoid wearing high heels.  Avoid sleeping on a mattress that is too soft or too hard. A mattress that is firm enough to support your back when you sleep may help to reduce your pain.  Keep all follow-up visits as told by your health care provider. This is important. Contact a health care provider if:  You have pain that: ? Wakes you up when you are sleeping. ? Gets worse when you lie down. ? Is worse than you have experienced in the past. ? Lasts longer than  4 weeks.  You have an unexplained weight loss. Get help right away if:  You are not able to control when you urinate or have bowel movements (incontinence).  You have: ? Weakness in your lower back, pelvis, buttocks, or legs that gets worse. ? Redness or swelling of your back. ? A burning sensation when you urinate. Summary  Sciatica is pain, numbness, weakness, or tingling along the path of the sciatic nerve.  This condition is caused by pressure on the sciatic nerve or pinching of the nerve.  Sciatica can cause pain, numbness, or tingling in the lower back, legs, hips, and buttocks.  Treatment often includes rest, exercise, medicines, and applying ice or heat. This information is not intended to replace advice given to you by your health care provider. Make sure you discuss any questions you have with your health care provider. Document Revised: 08/29/2018 Document Reviewed: 08/29/2018 Elsevier Patient Education  2021 [employer].

## 2020-12-05 NOTE — Progress Notes (Signed)
Established Patient Office Visit  Subjective:  Patient ID: Nicholas Espinoza, male    DOB: Oct 03, 1960  Age: 60 y.o. MRN: [id number]  CC:  Chief Complaint  Patient presents with  . Leg Pain    Right    HPI DAREY HERSHBERGER reports that he has been having low right-sided back pain that radiates down his right leg, states this has been present for the past 3 weeks.  Denies numbness or tingling, denies saddle anesthesia, denies injury or trauma, states that he has been taking ibuprofen without much relief.  Does state that sitting does offer some temporary relief.  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  Endorses significant history of peripheral vascular disease, had peripheral angiogram on September 02, 2020 with stent placed in left leg.  Is on Plavix.  Of note, patient was last seen by Dr. Gery Pray on November 29, 2020, this complaint was addressed and Dr. Gery Pray did state that " He does have some right leg pain which sounds more neurogenic than anything else."    Past Medical History:  Diagnosis Date  . Left ventricular diastolic dysfunction, NYHA class 1     Past Surgical History:  Procedure Laterality Date  . ABDOMINAL AORTOGRAM W/LOWER EXTREMITY Bilateral 09/02/2020   Procedure: ABDOMINAL AORTOGRAM W/LOWER EXTREMITY;  Surgeon: Runell Gess, MD;  Location: [facility] INVASIVE CV LAB;  Service: Cardiovascular;  Laterality: Bilateral;  . DENTAL SURGERY    . PERIPHERAL VASCULAR ATHERECTOMY Left 09/02/2020   Procedure: PERIPHERAL VASCULAR ATHERECTOMY;  Surgeon: Runell Gess, MD;  Location: [facility] INVASIVE CV LAB;  Service: Cardiovascular;  Laterality: Left;  . PERIPHERAL VASCULAR INTERVENTION Left 09/02/2020   Procedure: PERIPHERAL VASCULAR INTERVENTION;  Surgeon: Runell Gess, MD;  Location: [facility] INVASIVE CV LAB;  Service: Cardiovascular;  Laterality: Left;    Family History  Problem Relation Age of Onset  . Heart attack Mother   . Heart attack Brother 44  . Cancer -  Other Father   . Diabetes Sister   . Diabetes Sister     Social History   Socioeconomic History  . Marital status: Single    Spouse name: Not on file  . Number of children: 4  . Years of education: Not on file  . Highest education level: Not on file  Occupational History  . Occupation: unemployed  Tobacco Use  . Smoking status: Current Some Day Smoker    Packs/day: 25.00    Years: 0.50    Pack years: 12.50  . Smokeless tobacco: Never Used  . Tobacco comment: less then 1 cigarette daily  Vaping Use  . Vaping Use: Never used  Substance and Sexual Activity  . Alcohol use: Yes  . Drug use: Yes    Types: Marijuana  . Sexual activity: Yes  Other Topics Concern  . Not on file  Social History Narrative  . Not on file   Social Determinants of Health   Financial Resource Strain: Not on file  Food Insecurity: Not on file  Transportation Needs: Not on file  Physical Activity: Not on file  Stress: Not on file  Social Connections: Not on file  Intimate Partner Violence: Not on file    Outpatient Medications Prior to Visit  Medication Sig Dispense Refill  . aspirin EC 81 MG tablet Take 81 mg by mouth daily. Swallow whole.    . clopidogrel (PLAVIX) 75 MG tablet Take 1 tablet (75 mg total) by mouth daily with breakfast.  90 tablet 1  . gabapentin (NEURONTIN) 300 MG capsule TAKE 1 CAPSULE (300 MG TOTAL) BY MOUTH 3 (THREE) TIMES DAILY. 90 capsule 3  . rosuvastatin (CRESTOR) 20 MG tablet Take 1 tablet (20 mg total) by mouth daily. 90 tablet 3   Facility-Administered Medications Prior to Visit  Medication Dose Route Frequency Provider Last Rate Last Admin  . sodium chloride flush (NS) 0.9 % injection 3 mL  3 mL Intravenous Q12H Runell Gess, MD        No Known Allergies  ROS Review of Systems  Constitutional: Negative for chills and fever.  HENT: Negative.   Eyes: Negative.   Respiratory: Negative for shortness of breath.   Cardiovascular: Negative for chest pain.   Gastrointestinal: Negative.   Endocrine: Negative.   Genitourinary: Negative for difficulty urinating and testicular pain.  Musculoskeletal: Positive for back pain.  Skin: Negative.   Allergic/Immunologic: Negative.   Hematological: Negative.   Psychiatric/Behavioral: Negative.       Objective:    Physical Exam Vitals and nursing note reviewed.  Constitutional:      General: He is not in acute distress.    Appearance: Normal appearance. He is not ill-appearing.  HENT:     Head: Normocephalic and atraumatic.     Right Ear: External ear normal.     Left Ear: External ear normal.     Nose: Nose normal.     Mouth/Throat:     Pharynx: Oropharynx is clear.  Eyes:     Extraocular Movements: Extraocular movements intact.     Conjunctiva/sclera: Conjunctivae normal.     Pupils: Pupils are equal, round, and reactive to light.  Cardiovascular:     Rate and Rhythm: Normal rate and regular rhythm.     Pulses: Normal pulses.          Dorsalis pedis pulses are 2+ on the right side and 2+ on the left side.       Posterior tibial pulses are 2+ on the right side and 2+ on the left side.     Heart sounds: Normal heart sounds.  Pulmonary:     Effort: Pulmonary effort is normal.     Breath sounds: Normal breath sounds.  Musculoskeletal:     Cervical back: Normal, normal range of motion and neck supple.     Thoracic back: Normal.     Lumbar back: Tenderness present. No swelling. Decreased range of motion.  Skin:    General: Skin is warm and dry.  Neurological:     General: No focal deficit present.     Mental Status: He is alert and oriented to person, place, and time.  Psychiatric:        Mood and Affect: Mood normal.        Behavior: Behavior normal.        Thought Content: Thought content normal.     BP 120/85 (BP Location: Left Arm, Patient Position: Sitting, Cuff Size: Normal)   Pulse 71   Temp 98.2 F (36.8 C) (Oral)   Resp 18   Ht 5\' 10"  (1.778 m)   Wt 131 lb (59.4 kg)    SpO2 99%   BMI 18.80 kg/m  Wt Readings from Last 3 Encounters:  12/05/20 131 lb (59.4 kg)  11/29/20 131 lb (59.4 kg)  11/07/20 127 lb (57.6 kg)     Health Maintenance Due  Topic Date Due  . Hepatitis C Screening  Never done  . COVID-19 Vaccine (1) Never done  . HIV  Screening  Never done  . TETANUS/TDAP  Never done    There are no preventive care reminders to display for this patient.  Lab Results  Component Value Date   TSH 1.290 02/12/2020   Lab Results  Component Value Date   WBC 7.8 08/12/2020   HGB 14.6 08/12/2020   HCT 44.1 08/12/2020   MCV 97 08/12/2020   PLT 167 08/12/2020   Lab Results  Component Value Date   NA 141 08/12/2020   K 4.6 08/12/2020   CO2 27 08/12/2020   GLUCOSE 78 08/12/2020   BUN 14 08/12/2020   CREATININE 0.98 08/12/2020   CALCIUM 9.6 08/12/2020   ANIONGAP 11 01/15/2020   Lab Results  Component Value Date   CHOL 169 05/01/2020   Lab Results  Component Value Date   HDL 54 05/01/2020   Lab Results  Component Value Date   LDLCALC 100 (H) 05/01/2020   Lab Results  Component Value Date   TRIG 79 05/01/2020   Lab Results  Component Value Date   CHOLHDL 3.1 05/01/2020   Lab Results  Component Value Date   HGBA1C 5.4 01/24/2020      Assessment & Plan:   Problem List Items Addressed This Visit      Cardiovascular and Mediastinum   Peripheral vascular disease, unspecified (HCC)    Other Visit Diagnoses    Acute right-sided low back pain with right-sided sciatica       Relevant Medications   methylPREDNISolone acetate (DEPO-MEDROL) injection 80 mg (Completed)     1. Acute right-sided low back pain with right-sided sciatica Patient education given on importance of avoiding NSAIDs, encouraged patient to do gentle stretching, icing, heat.  Red flags given for prompt reevaluation. - methylPREDNISolone acetate (DEPO-MEDROL) injection 80 mg  2. Peripheral vascular disease, unspecified (HCC)   I have reviewed the  patient's medical history (PMH, PSH, Social History, Family History, Medications, and allergies) , and have been updated if relevant. I spent 31 minutes reviewing chart and  face to face time with patient.     Meds ordered this encounter  Medications  . methylPREDNISolone acetate (DEPO-MEDROL) injection 80 mg    Follow-up: Return if symptoms worsen or fail to improve.    Kasandra Knudsen Mayers, PA-C

## 2020-12-05 NOTE — Progress Notes (Signed)
Patient complains of right leg pain beginning 3 weeks ago. Patient has used ibuprofen with minimal relief described as sharp pain. 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 Pain begins in the buttocks and radiates down.

## 2020-12-06 DIAGNOSIS — M5441 Lumbago with sciatica, right side: Secondary | ICD-10-CM | POA: Insufficient documentation

## 2020-12-10 ENCOUNTER — Other Ambulatory Visit: Payer: Self-pay

## 2021-01-02 ENCOUNTER — Ambulatory Visit: Payer: Self-pay | Attending: Physician Assistant | Admitting: Physician Assistant

## 2021-01-02 ENCOUNTER — Other Ambulatory Visit: Payer: Self-pay

## 2021-01-02 ENCOUNTER — Encounter: Payer: Self-pay | Admitting: Physician Assistant

## 2021-01-02 DIAGNOSIS — M5441 Lumbago with sciatica, right side: Secondary | ICD-10-CM

## 2021-01-02 NOTE — Progress Notes (Signed)
Virtual Visit via Telephone Note  I connected with Nicholas Espinoza on 01/02/21 at  1:50 PM EDT by telephone and verified that I am speaking with the correct person using two identifiers.  Location: Patient: home Provider: [facility] office   I discussed the limitations, risks, security and privacy concerns of performing an evaluation and management service by telephone and the availability of in person appointments. I also discussed with the patient that there may be a patient responsible charge related to this service. The patient expressed understanding and agreed to proceed.   History of Present Illness:  He is calling today for recheck on his leg pain/R sciatica.  He was seen by Maurene Capes mid April and was given 80 IM depomedrol.  He is much improved and says pain is gone.  No other or new issues today.    Observations/Objective:  NAD.  A&Ox3   Assessment and Plan: 1. Acute right-sided low back pain with right-sided sciatica resolved   Follow Up Instructions: See PCP in about 2-3 months for chronic conditions   I discussed the assessment and treatment plan with the patient. The patient was provided an opportunity to ask questions and all were answered. The patient agreed with the plan and demonstrated an understanding of the instructions.   The patient was advised to call back or seek an in-person evaluation if the symptoms worsen or if the condition fails to improve as anticipated.  I provided 8 minutes of non-face-to-face time during this encounter.   Georgian Co, PA-C  Patient ID: Nicholas Espinoza, male   DOB: 1961-07-17, 61 y.o.   MRN: [id number]

## 2021-01-13 ENCOUNTER — Other Ambulatory Visit: Payer: Self-pay

## 2021-01-15 ENCOUNTER — Other Ambulatory Visit: Payer: Self-pay

## 2021-02-27 ENCOUNTER — Other Ambulatory Visit: Payer: Self-pay

## 2021-03-18 ENCOUNTER — Other Ambulatory Visit: Payer: Self-pay

## 2021-03-18 ENCOUNTER — Ambulatory Visit: Payer: Self-pay | Attending: Nurse Practitioner | Admitting: Nurse Practitioner

## 2021-03-18 ENCOUNTER — Telehealth: Payer: Self-pay | Admitting: Nurse Practitioner

## 2021-03-18 ENCOUNTER — Encounter: Payer: Self-pay | Admitting: Nurse Practitioner

## 2021-03-18 DIAGNOSIS — G629 Polyneuropathy, unspecified: Secondary | ICD-10-CM

## 2021-03-18 DIAGNOSIS — M5441 Lumbago with sciatica, right side: Secondary | ICD-10-CM

## 2021-03-18 MED ORDER — GABAPENTIN 300 MG PO CAPS
ORAL_CAPSULE | Freq: Three times a day (TID) | ORAL | 3 refills | Status: DC
Start: 2021-03-18 — End: 2022-03-04
  Filled 2021-03-18: qty 90, 30d supply, fill #0
  Filled 2021-09-15: qty 90, fill #0
  Filled 2021-09-15: qty 90, 30d supply, fill #0

## 2021-03-18 NOTE — Progress Notes (Signed)
Virtual Visit via Telephone Note Due to national recommendations of social distancing due to COVID 19, telehealth visit is felt to be most appropriate for this patient at this time.  I discussed the limitations, risks, security and privacy concerns of performing an evaluation and management service by telephone and the availability of in person appointments. I also discussed with the patient that there may be a patient responsible charge related to this service. The patient expressed understanding and agreed to proceed.    I connected with Nicholas Espinoza on 03/18/21  at  10:30 AM EDT  EDT by telephone and verified that I am speaking with the correct person using two identifiers.  Location of Patient: Private Residence   Location of Provider: [facility]    Persons participating in Telemedicine visit: Nicholas Denver FNP-BC Nicholas Espinoza    History of Present Illness: Telemedicine visit for: Follow up to Back pain He has a past medical history of  CAD, PVD, HPL and Left ventricular diastolic dysfunction, NYHA class 1.   Evaluated a few months ago for acute right sided low back pain with right sided sciatica. Reports back pain has not been completely relieved however he has not been taking the gabapentin that was ordered as prescribed. Only taking as needed and once or twice a day. I have recommended he take it three times a day and we will re evaulate its effectiveness in a few weeks. He denies any involuntary loss of stool or urine.     Past Medical History:  Diagnosis Date   Left ventricular diastolic dysfunction, NYHA class 1     Past Surgical History:  Procedure Laterality Date   ABDOMINAL AORTOGRAM W/LOWER EXTREMITY Bilateral 09/02/2020   Procedure: ABDOMINAL AORTOGRAM W/LOWER EXTREMITY;  Surgeon: Runell Gess, MD;  Location: [facility] INVASIVE CV LAB;  Service: Cardiovascular;  Laterality: Bilateral;   DENTAL SURGERY     PERIPHERAL VASCULAR  ATHERECTOMY Left 09/02/2020   Procedure: PERIPHERAL VASCULAR ATHERECTOMY;  Surgeon: Runell Gess, MD;  Location: [facility] INVASIVE CV LAB;  Service: Cardiovascular;  Laterality: Left;   PERIPHERAL VASCULAR INTERVENTION Left 09/02/2020   Procedure: PERIPHERAL VASCULAR INTERVENTION;  Surgeon: Runell Gess, MD;  Location: [facility] INVASIVE CV LAB;  Service: Cardiovascular;  Laterality: Left;    Family History  Problem Relation Age of Onset   Heart attack Mother    Heart attack Brother 67   Cancer - Other Father    Diabetes Sister    Diabetes Sister     Social History   Socioeconomic History   Marital status: Single    Spouse name: Not on file   Number of children: 4   Years of education: Not on file   Highest education level: Not on file  Occupational History   Occupation: unemployed  Tobacco Use   Smoking status: Some Days    Packs/day: 25.00    Years: 0.50    Pack years: 12.50    Types: Cigarettes   Smokeless tobacco: Never   Tobacco comments:    less then 1 cigarette daily  Vaping Use   Vaping Use: Never used  Substance and Sexual Activity   Alcohol use: Yes   Drug use: Yes    Types: Marijuana   Sexual activity: Yes  Other Topics Concern   Not on file  Social History Narrative   Not on file   Social Determinants of Health   Financial Resource Strain: Not on file  Food Insecurity: Not on  file  Transportation Needs: Not on file  Physical Activity: Not on file  Stress: Not on file  Social Connections: Not on file     Observations/Objective: Awake, alert and oriented x 3   Review of Systems  Constitutional:  Negative for fever, malaise/fatigue and weight loss.  HENT: Negative.  Negative for nosebleeds.   Eyes: Negative.  Negative for blurred vision, double vision and photophobia.  Respiratory: Negative.  Negative for cough and shortness of breath.   Cardiovascular: Negative.  Negative for chest pain, palpitations and leg swelling.  Gastrointestinal: Negative.   Negative for heartburn, nausea and vomiting.  Musculoskeletal:  Positive for back pain. Negative for myalgias.  Neurological:  Positive for sensory change. Negative for dizziness, focal weakness, seizures and headaches.  Psychiatric/Behavioral: Negative.  Negative for suicidal ideas.    Assessment and Plan: Diagnoses and all orders for this visit:  Acute right-sided low back pain with right-sided sciatica -     gabapentin (NEURONTIN) 300 MG capsule; TAKE 1 CAPSULE (300 MG TOTAL) BY MOUTH 3 (THREE) TIMES DAILY.    Follow Up Instructions Return for Physical ONLY no labs.     I discussed the assessment and treatment plan with the patient. The patient was provided an opportunity to ask questions and all were answered. The patient agreed with the plan and demonstrated an understanding of the instructions.   The patient was advised to call back or seek an in-person evaluation if the symptoms worsen or if the condition fails to improve as anticipated.  I provided 12 minutes of non-face-to-face time during this encounter including median intraservice time, reviewing previous notes, labs, imaging, medications and explaining diagnosis and management.  Claiborne Rigg, FNP-BC

## 2021-03-18 NOTE — Telephone Encounter (Signed)
Unable to reach. LVM. Will call back in 5 minutes

## 2021-03-20 ENCOUNTER — Other Ambulatory Visit: Payer: Self-pay | Admitting: Nurse Practitioner

## 2021-03-20 ENCOUNTER — Encounter: Payer: Self-pay | Admitting: Nurse Practitioner

## 2021-03-20 DIAGNOSIS — I2510 Atherosclerotic heart disease of native coronary artery without angina pectoris: Secondary | ICD-10-CM

## 2021-03-20 DIAGNOSIS — Z114 Encounter for screening for human immunodeficiency virus [HIV]: Secondary | ICD-10-CM

## 2021-03-20 DIAGNOSIS — I2583 Coronary atherosclerosis due to lipid rich plaque: Secondary | ICD-10-CM

## 2021-03-20 DIAGNOSIS — Z1211 Encounter for screening for malignant neoplasm of colon: Secondary | ICD-10-CM

## 2021-03-20 DIAGNOSIS — E785 Hyperlipidemia, unspecified: Secondary | ICD-10-CM

## 2021-03-20 DIAGNOSIS — D696 Thrombocytopenia, unspecified: Secondary | ICD-10-CM

## 2021-03-20 DIAGNOSIS — Z1159 Encounter for screening for other viral diseases: Secondary | ICD-10-CM

## 2021-03-25 ENCOUNTER — Other Ambulatory Visit: Payer: Self-pay

## 2021-04-02 ENCOUNTER — Other Ambulatory Visit: Payer: Self-pay

## 2021-04-02 ENCOUNTER — Other Ambulatory Visit (HOSPITAL_COMMUNITY): Payer: Self-pay | Admitting: Cardiovascular Disease

## 2021-04-02 ENCOUNTER — Ambulatory Visit (HOSPITAL_COMMUNITY)
Admission: RE | Admit: 2021-04-02 | Discharge: 2021-04-02 | Disposition: A | Discharge status: Home / Self Care | Payer: Self-pay | Source: Ambulatory Visit | Attending: Cardiology | Admitting: Cardiology

## 2021-04-02 DIAGNOSIS — I739 Peripheral vascular disease, unspecified: Secondary | ICD-10-CM | POA: Insufficient documentation

## 2021-04-02 DIAGNOSIS — Z95828 Presence of other vascular implants and grafts: Secondary | ICD-10-CM | POA: Insufficient documentation

## 2021-04-02 DIAGNOSIS — Z959 Presence of cardiac and vascular implant and graft, unspecified: Secondary | ICD-10-CM

## 2021-04-03 IMAGING — CT CT HEART MORP W/ CTA COR W/ SCORE W/ CA W/CM &/OR W/O CM
4 of 7 series · 8 of 20 positions shown, 9 images · IV contrast (APPLIED)
Comparison: None.
COMPARISON: None.

Addendum:
EXAM:
OVER-READ INTERPRETATION  CT CHEST

The following report is an over-read performed by radiologist Dr.
Zwaghi Pawlo [REDACTED] on 07/08/2020. This
over-read does not include interpretation of cardiac or coronary
anatomy or pathology. The coronary calcium score/coronary CTA
interpretation by the cardiologist is attached.
CLINICAL DATA: Chest pain
Cardiac CTA
MEDICATIONS:
Sub lingual nitro. 4 mg and lopressor 50mg
TECHNIQUE: The patient was scanned on a Siemens Force 192 scanner. Gantry
rotation speed was 250 msecs. Collimation was. 6 mm . A 120 kV
prospective scan was triggered in the ascending thoracic aorta at
140 HU's with full mA between 30-70% of the R-R interval . Average
HR during the scan was bpm. The 3D data set was interpreted on a
dedicated work station using MPR, MIP and VRT modes. A total of 80
cc of contrast was used.

[Series 6: best diast 72 % · axial · 0.39mm/px · z∈[-158,-113]mm · 2 of 338 slices shown]
[im 113/338  vessel]
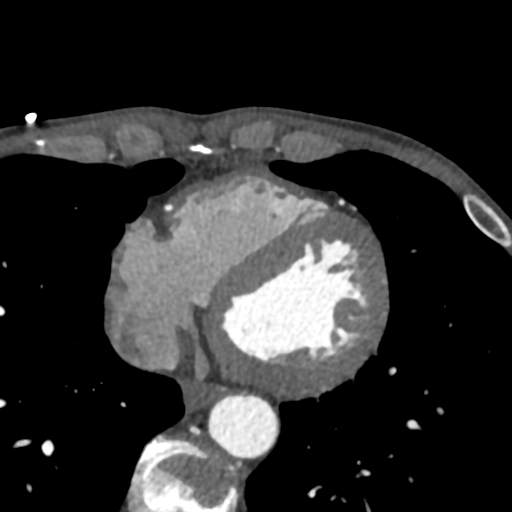
[im 225/338  vessel]
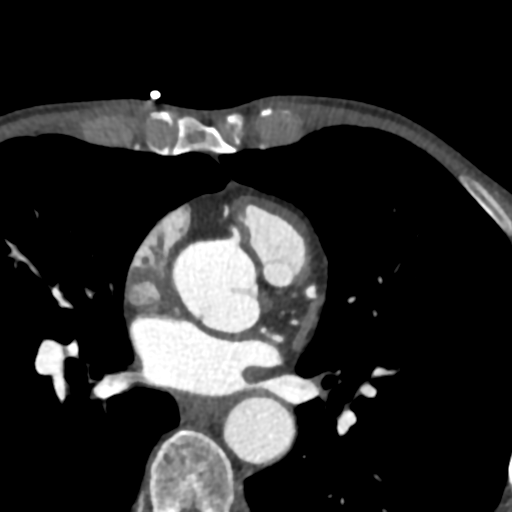

[Series 7: best syst · axial · 0.39mm/px · z∈[-158,-113]mm · 2 of 338 slices shown, 3 images]
[im 113/338  vessel]
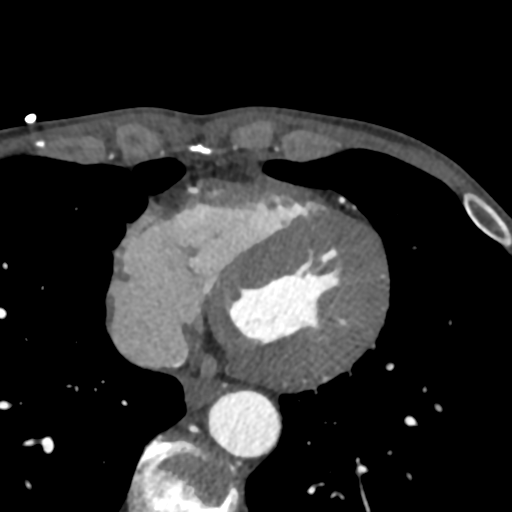
[im 113/338  lung]
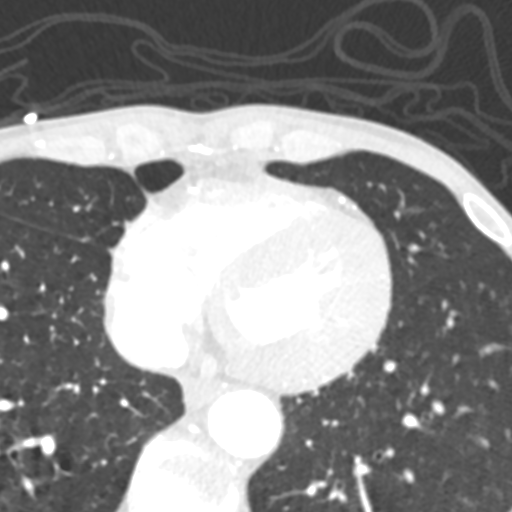
[im 225/338  vessel]
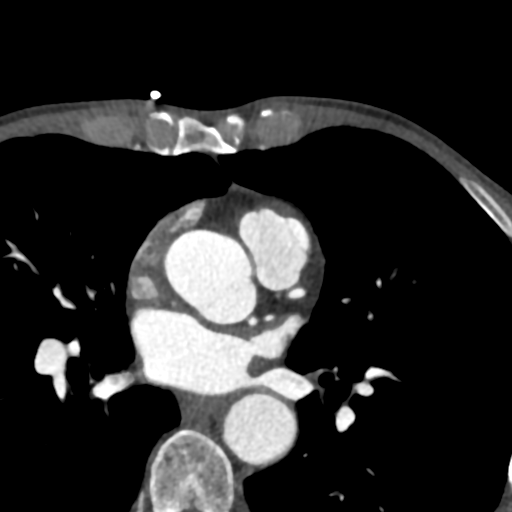

[Series 8: ts diast sharp 72 % · axial · 0.39mm/px · z∈[-158,-113]mm · 2 of 338 slices shown]
[im 113/338  lung]
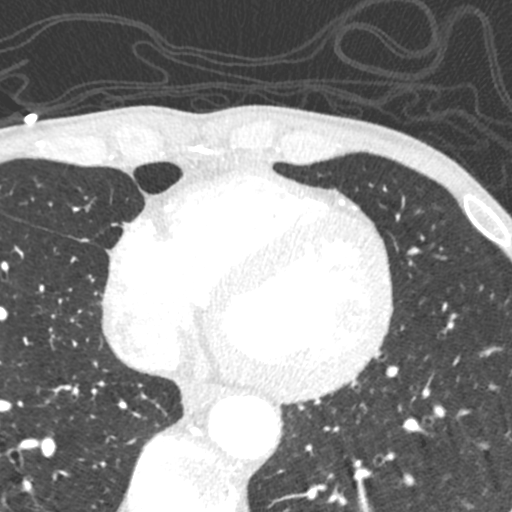
[im 225/338  lung]
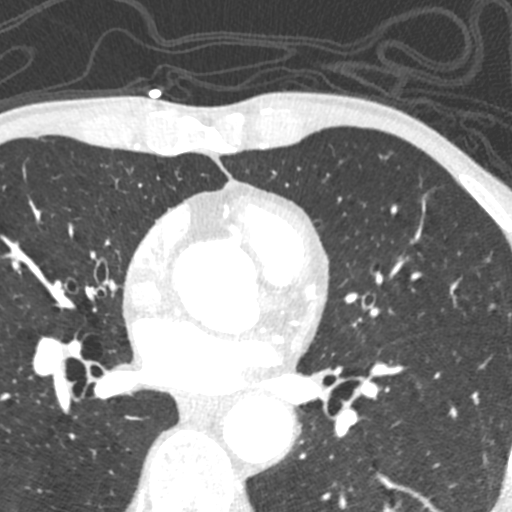

[Series 9: ts syst sharp · axial · 0.39mm/px · z∈[-158,-113]mm · 2 of 338 slices shown]
[im 113/338  lung]
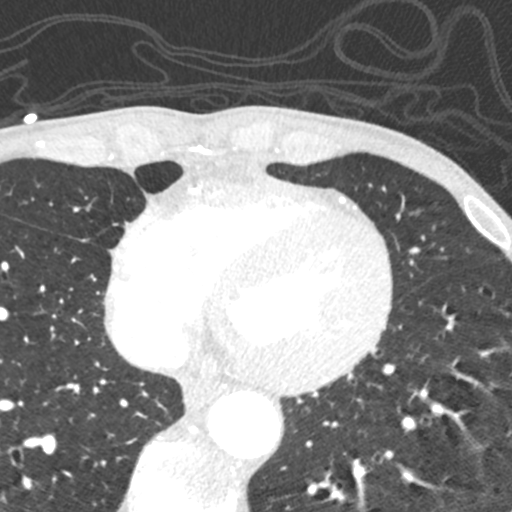
[im 225/338  lung]
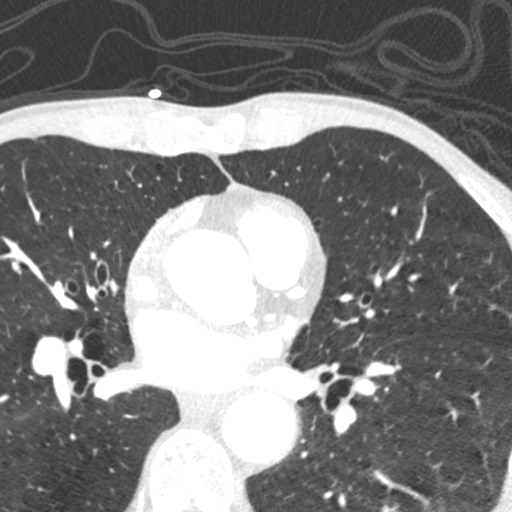

[8 of 20 positions shown; findings below may reference images not displayed]

FINDINGS: Within the visualized portions of the thorax there are no suspicious
appearing pulmonary nodules or masses, there is no acute
consolidative airspace disease, no pleural effusions, no
pneumothorax and no lymphadenopathy. Visualized portions of the
upper abdomen are unremarkable. There are no aggressive appearing
lytic or blastic lesions noted in the visualized portions of the
skeleton.
IMPRESSION: 1. No significant incidental noncardiac findings are noted.
FINDINGS: Non-cardiac: See separate report from [REDACTED]. No
significant findings on limited lung and soft tissue windows.

Calcium score: Calcium noted in proximal LAD

Coronary Arteries: Right dominant with no anomalies

LM: Normal

LAD: 1-24% calcified ostial LAD 1-24% mixed plaque in proximal
vessel with external remodeling

IM: Medium sized vessel normal

D1: Normal

D2: Normal

Circumflex: Normal

OM1: Normal

AV Groove: Normal

RCA: Normal

PDA: Normal

PLA: Normal
IMPRESSION: 1.  Calcium score 53 which is 63 [REDACTED] percentile for age / sex

2.   Normal aortic root 3.5 cm

3.   CAD RADS 1 non-obstructive CAD in ASANKA PRABHATH

Lkw Tiger

*** End of Addendum ***
EXAM:
OVER-READ INTERPRETATION  CT CHEST

The following report is an over-read performed by radiologist Dr.
Zwaghi Pawlo [REDACTED] on 07/08/2020. This
over-read does not include interpretation of cardiac or coronary
anatomy or pathology. The coronary calcium score/coronary CTA
interpretation by the cardiologist is attached.
FINDINGS: Within the visualized portions of the thorax there are no suspicious
appearing pulmonary nodules or masses, there is no acute
consolidative airspace disease, no pleural effusions, no
pneumothorax and no lymphadenopathy. Visualized portions of the
upper abdomen are unremarkable. There are no aggressive appearing
lytic or blastic lesions noted in the visualized portions of the
skeleton.
IMPRESSION: 1. No significant incidental noncardiac findings are noted.

## 2021-04-07 ENCOUNTER — Telehealth: Payer: Self-pay | Admitting: Cardiovascular Disease

## 2021-04-07 NOTE — Telephone Encounter (Signed)
Called patient, gave results.  ?Patient verbalized understanding.  ? ? ?

## 2021-04-07 NOTE — Telephone Encounter (Signed)
Patient is returning call to discuss ABI W/WO TBI results.

## 2021-04-08 ENCOUNTER — Other Ambulatory Visit: Payer: Self-pay

## 2021-04-08 DIAGNOSIS — I739 Peripheral vascular disease, unspecified: Secondary | ICD-10-CM

## 2021-04-08 DIAGNOSIS — Z95828 Presence of other vascular implants and grafts: Secondary | ICD-10-CM

## 2021-04-08 NOTE — Progress Notes (Signed)
vas 

## 2021-05-20 ENCOUNTER — Other Ambulatory Visit: Payer: Self-pay

## 2021-05-21 ENCOUNTER — Other Ambulatory Visit: Payer: Self-pay

## 2021-06-12 ENCOUNTER — Other Ambulatory Visit: Payer: Self-pay

## 2021-06-12 ENCOUNTER — Ambulatory Visit: Payer: Self-pay | Attending: Internal Medicine | Admitting: Internal Medicine

## 2021-06-12 VITALS — BP 122/82 | HR 92 | Resp 16 | Wt 127.6 lb

## 2021-06-12 DIAGNOSIS — M5440 Lumbago with sciatica, unspecified side: Secondary | ICD-10-CM

## 2021-06-12 MED ORDER — PREDNISONE 20 MG PO TABS
ORAL_TABLET | ORAL | 0 refills | Status: DC
  Filled 2021-06-12: qty 5, 7d supply, fill #0

## 2021-06-12 MED ORDER — METHOCARBAMOL 500 MG PO TABS
500.0000 mg | ORAL_TABLET | Freq: Every day | ORAL | 0 refills | Status: DC | PRN
  Filled 2021-06-12: qty 10, 10d supply, fill #0

## 2021-06-12 NOTE — Progress Notes (Signed)
Patient ID: Nicholas Espinoza, male    DOB: 1961-06-11  MRN: [id number]  CC: Back Pain   Subjective: Nicholas Espinoza is a 60 y.o. male who presents for UC visit His concerns today include:  PAD, HL  Pt c/o pain across lower x 5 days.  No initiating factors Radiates to upper thigh BL.  No numbness or weakness.  No incontinence of bowel or bladder.  No dysuria.  -constant pain.  Rates 7-8/10 Better laying down but worse when he goes to get up.  Worse with prolong standing and walking. Took Tylenol which eases pain some Similar pain several mths ago.  Given Steroid shot at that time with resolution of the pain  Patient Active Problem List   Diagnosis Date Noted   Acute right-sided low back pain with right-sided sciatica 12/06/2020   Peripheral vascular disease, unspecified (HCC) 07/30/2020   Palpitations 04/04/2020   Abnormal ECG 04/04/2020     Current Outpatient Medications on File Prior to Visit  Medication Sig Dispense Refill   aspirin EC 81 MG tablet Take 81 mg by mouth daily. Swallow whole.     clopidogrel (PLAVIX) 75 MG tablet Take 1 tablet (75 mg total) by mouth daily with breakfast. 90 tablet 1   gabapentin (NEURONTIN) 300 MG capsule TAKE 1 CAPSULE (300 MG TOTAL) BY MOUTH 3 (THREE) TIMES DAILY. 90 capsule 3   rosuvastatin (CRESTOR) 20 MG tablet Take 1 tablet (20 mg total) by mouth daily. 90 tablet 3   [DISCONTINUED] albuterol (PROVENTIL HFA;VENTOLIN HFA) 108 (90 Base) MCG/ACT inhaler Inhale 2 puffs into the lungs every 6 (six) hours as needed for wheezing or shortness of breath. 1 Inhaler 2   Current Facility-Administered Medications on File Prior to Visit  Medication Dose Route Frequency Provider Last Rate Last Admin   sodium chloride flush (NS) 0.9 % injection 3 mL  3 mL Intravenous Q12H Runell Gess, MD        No Known Allergies  Social History   Socioeconomic History   Marital status: Single    Spouse name: Not on file   Number of children: 4   Years of  education: Not on file   Highest education level: Not on file  Occupational History   Occupation: unemployed  Tobacco Use   Smoking status: Some Days    Packs/day: 25.00    Years: 0.50    Pack years: 12.50    Types: Cigarettes   Smokeless tobacco: Never   Tobacco comments:    less then 1 cigarette daily  Vaping Use   Vaping Use: Never used  Substance and Sexual Activity   Alcohol use: Yes   Drug use: Yes    Types: Marijuana   Sexual activity: Yes  Other Topics Concern   Not on file  Social History Narrative   Not on file   Social Determinants of Health   Financial Resource Strain: Not on file  Food Insecurity: Not on file  Transportation Needs: Not on file  Physical Activity: Not on file  Stress: Not on file  Social Connections: Not on file  Intimate Partner Violence: Not on file    Family History  Problem Relation Age of Onset   Heart attack Mother    Heart attack Brother 38   Cancer - Other Father    Diabetes Sister    Diabetes Sister     Past Surgical History:  Procedure Laterality Date   ABDOMINAL AORTOGRAM W/LOWER EXTREMITY Bilateral 09/02/2020   Procedure: ABDOMINAL  AORTOGRAM W/LOWER EXTREMITY;  Surgeon: Runell Gess, MD;  Location: [facility] INVASIVE CV LAB;  Service: Cardiovascular;  Laterality: Bilateral;   DENTAL SURGERY     PERIPHERAL VASCULAR ATHERECTOMY Left 09/02/2020   Procedure: PERIPHERAL VASCULAR ATHERECTOMY;  Surgeon: Runell Gess, MD;  Location: [facility] INVASIVE CV LAB;  Service: Cardiovascular;  Laterality: Left;   PERIPHERAL VASCULAR INTERVENTION Left 09/02/2020   Procedure: PERIPHERAL VASCULAR INTERVENTION;  Surgeon: Runell Gess, MD;  Location: [facility] INVASIVE CV LAB;  Service: Cardiovascular;  Laterality: Left;    ROS: Review of Systems Negative except as stated above  PHYSICAL EXAM: BP 122/82   Pulse 92   Resp 16   Wt 127 lb 9.6 oz (57.9 kg)   SpO2 98%   BMI 18.31 kg/m   Physical Exam   General appearance - alert, well  appearing, older AAM and in no distress Mental status - normal mood, behavior, speech, dress, motor activity, and thought processes Neurological -power in the lower extremities 5/5 proximally and distally.  Gross sensation intact in both lower extremities.  Straight leg raise negative. Musculoskeletal -no tenderness on palpation of the lumbar spine or surrounding paraspinal muscles.  CMP Latest Ref Rng & Units 08/12/2020 01/24/2020 01/15/2020  Glucose 65 - 99 mg/dL 78 86 401(U)  BUN 6 - 24 mg/dL 14 19 16   Creatinine 0.76 - 1.27 mg/dL 2.72 5.36  Sodium 134 - 144 mmol/L 141 142 135  Potassium 3.5 - 5.2 mmol/L 4.6 4.9 3.4(L)  Chloride 96 - 106 mmol/L 102 105 104  CO2 20 - 29 mmol/L 27 25 20(L)  Calcium 8.7 - 10.2 mg/dL 9.6 9.3 6.44)   Lipid Panel     Component Value Date/Time   CHOL 169 05/01/2020 0951   TRIG 79 05/01/2020 0951   HDL 54 05/01/2020 0951   CHOLHDL 3.1 05/01/2020 0951   LDLCALC 100 (H) 05/01/2020 0951    CBC    Component Value Date/Time   WBC 7.8 08/12/2020 1217   WBC 12.2 (H) 01/15/2020 0644   RBC 4.55 08/12/2020 1217   RBC 3.93 (L) 01/15/2020 0644   HGB 14.6 08/12/2020 1217   HCT 44.1 08/12/2020 1217   PLT 167 08/12/2020 1217   MCV 97 08/12/2020 1217   MCH 32.1 08/12/2020 1217   MCH 32.8 01/15/2020 0644   MCHC 33.1 08/12/2020 1217   MCHC 34.3 01/15/2020 0644   RDW 13.0 08/12/2020 1217   LYMPHSABS 3.6 (H) 01/24/2020 1122   MONOABS 0.6 09/20/2009 1508   EOSABS 0.1 01/24/2020 1122   BASOSABS 0.1 01/24/2020 1122    ASSESSMENT AND PLAN: 1. Acute bilateral low back pain with sciatica, sciatica laterality unspecified Recommend using a heating pad as needed. I will give a tapering dose of prednisone since he did get relief with steroid when he had similar episode several months ago.  I have also given some Robaxin to use as needed.  Advised that the Robaxin can cause drowsiness. - predniSONE (DELTASONE) 20 MG tablet; Take 1 tablet by mouth daily for 3 days,  then 1/2 tablet for 4 days  Dispense: 5 tablet; Refill: 0 - methocarbamol (ROBAXIN) 500 MG tablet; Take 1 tablet (500 mg total) by mouth daily as needed for muscle spasms.  Dispense: 10 tablet; Refill: 0   Patient was given the opportunity to ask questions.  Patient verbalized understanding of the plan and was able to repeat key elements of the plan.   No orders of the defined types were placed in this encounter.  Requested Prescriptions    No prescriptions requested or ordered in this encounter    No follow-ups on file.  Karle Plumber, MD, FACP

## 2021-07-04 ENCOUNTER — Other Ambulatory Visit: Payer: Self-pay

## 2021-08-14 ENCOUNTER — Other Ambulatory Visit: Payer: Self-pay

## 2021-08-14 ENCOUNTER — Other Ambulatory Visit: Payer: Self-pay | Admitting: Cardiology

## 2021-08-14 MED ORDER — CLOPIDOGREL BISULFATE 75 MG PO TABS
75.0000 mg | ORAL_TABLET | Freq: Every day | ORAL | 4 refills | Status: DC
  Filled 2021-08-14: qty 30, 30d supply, fill #0
  Filled 2021-09-19: qty 30, 30d supply, fill #1
  Filled 2021-09-19: qty 30, 30d supply, fill #0
  Filled 2021-10-30: qty 30, 30d supply, fill #1
  Filled 2021-12-11 – 2021-12-18 (×2): qty 30, 30d supply, fill #2
  Filled 2022-02-02: qty 30, 30d supply, fill #3
  Filled 2022-03-13: qty 30, 30d supply, fill #4
  Filled 2022-04-17: qty 30, 30d supply, fill #5
  Filled 2022-05-19: qty 30, 30d supply, fill #6
  Filled 2022-07-06: qty 30, 30d supply, fill #7

## 2021-08-15 ENCOUNTER — Other Ambulatory Visit: Payer: Self-pay

## 2021-09-15 ENCOUNTER — Other Ambulatory Visit: Payer: Self-pay

## 2021-09-19 ENCOUNTER — Other Ambulatory Visit: Payer: Self-pay

## 2021-09-25 ENCOUNTER — Other Ambulatory Visit: Payer: Self-pay

## 2021-09-26 ENCOUNTER — Other Ambulatory Visit: Payer: Self-pay

## 2021-10-30 ENCOUNTER — Other Ambulatory Visit: Payer: Self-pay

## 2021-10-31 ENCOUNTER — Other Ambulatory Visit: Payer: Self-pay

## 2021-12-03 ENCOUNTER — Ambulatory Visit: Payer: Self-pay

## 2021-12-03 NOTE — Telephone Encounter (Signed)
? ?  Chief Complaint: Dizzy spells, getting worse ?Symptoms: Dizzy ?Frequency: Started 1 month ago ?Pertinent Negatives: Patient denies  ?Disposition: [] ED /[] Urgent Care (no appt availability in office) / [] Appointment(In office/virtual)/ []  [place]/ [] Home Care/ [] Refused Recommended Disposition /[] [facility]/ []  Follow-up with PCP ?Additional Notes: Pt. Is off work this Friday, asking to be worked in. Please advise.  ? ?Answer Assessment - Initial Assessment Questions ?1. DESCRIPTION: "Describe your dizziness." ?    Dizzy ?2. LIGHTHEADED: "Do you feel lightheaded?" (e.g., somewhat faint, woozy, weak upon standing) ?    Faint ?3. VERTIGO: "Do you feel like either you or the room is spinning or tilting?" (i.e. vertigo) ?    No ?4. SEVERITY: "How bad is it?"  "Do you feel like you are going to faint?" "Can you stand and walk?" ?  - MILD: Feels slightly dizzy, but walking normally. ?  - MODERATE: Feels unsteady when walking, but not falling; interferes with normal activities (e.g., school, work). ?  - SEVERE: Unable to walk without falling, or requires assistance to walk without falling; feels like passing out now.  ?    Moderate ?5. ONSET:  "When did the dizziness begin?" ?    Years, but has come back 1 month ago ?6. AGGRAVATING FACTORS: "Does anything make it worse?" (e.g., standing, change in head position) ?    Walking around ?7. HEART RATE: "Can you tell me your heart rate?" "How many beats in 15 seconds?"  (Note: not all patients can do this)   ?    No ?8. CAUSE: "What do you think is causing the dizziness?" ?    Unsure ?9. RECURRENT SYMPTOM: "Have you had dizziness before?" If Yes, ask: "When was the last time?" "What happened that time?" ?    Yes ?10. OTHER SYMPTOMS: "Do you have any other symptoms?" (e.g., fever, chest pain, vomiting, diarrhea, bleeding) ?      Mild headache ?11. PREGNANCY: "Is there any chance you are pregnant?" "When was your last menstrual period?" ?       N/a ? ?Protocols used: Dizziness - Lightheadedness-A-AH ? ?

## 2021-12-04 ENCOUNTER — Telehealth: Payer: Self-pay | Admitting: Nurse Practitioner

## 2021-12-04 NOTE — Telephone Encounter (Signed)
Copied from CRM [id number]. Topic: General - Other ?>> Dec 04, 2021 12:39 PM Jaquita Rector A wrote: ?Reason for CRM: Patient called back stated that he spoke to a nurse on 12/03/21 waiting to hear from Ms Meredeth Ide nurse to get an appointment to be seen ASAP but have not gotten any response since yesterday. Please call patient at Ph# [phone] ?

## 2021-12-05 NOTE — Telephone Encounter (Signed)
He has to go by what is on the schedule. He can see first available with anyone. If it is urgent he needs to go to urgent care.  ?

## 2021-12-09 ENCOUNTER — Encounter: Payer: Self-pay | Admitting: Physician Assistant

## 2021-12-09 ENCOUNTER — Ambulatory Visit (INDEPENDENT_AMBULATORY_CARE_PROVIDER_SITE_OTHER): Payer: Managed Care, Other (non HMO) | Admitting: Physician Assistant

## 2021-12-09 VITALS — BP 129/88 | HR 88 | Temp 98.2°F | Resp 18 | Ht 70.0 in | Wt 128.0 lb

## 2021-12-09 DIAGNOSIS — R599 Enlarged lymph nodes, unspecified: Secondary | ICD-10-CM | POA: Diagnosis not present

## 2021-12-09 DIAGNOSIS — R42 Dizziness and giddiness: Secondary | ICD-10-CM

## 2021-12-09 NOTE — Progress Notes (Signed)
Patient has eaten and taken mediation today. ?Patient denies pain at this time. ?Patient reports dizzy episodes occurring the last month. Most recent episode was 12/03/21. Patient reports SOB after the episodes. ?

## 2021-12-09 NOTE — Progress Notes (Signed)
? ?Established Patient Office Visit ? ?Subjective   ?Patient ID: Nicholas Espinoza, male    DOB: 23-Dec-1960  Age: 61 y.o. MRN: [id number] ? ?Chief Complaint  ?Patient presents with  ? Dizziness  ? ? ?States that he has been having episodes of dizziness over the past month.  States that the episodes only seem to happen when he is going to work.  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  States that he does sit down and rest with relief.  Does not check his blood pressure or blood glucose levels during these episodes.  States that these episodes have not happened at home, do not happen any other time during the day. ? ?States that he "hardly drinks any water" ? ? ?Past Medical History:  ?Diagnosis Date  ? Left ventricular diastolic dysfunction, NYHA class 1   ? ?Social History  ? ?Tobacco Use  ? Smoking status: Some Days  ?  Packs/day: 25.00  ?  Years: 0.50  ?  Pack years: 12.50  ?  Types: Cigarettes  ? Smokeless tobacco: Never  ? Tobacco comments:  ?  less then 1 cigarette daily  ?Vaping Use  ? Vaping Use: Never used  ?Substance Use Topics  ? Alcohol use: Yes  ? Drug use: Yes  ?  Types: Marijuana  ? ?Family History  ?Problem Relation Age of Onset  ? Heart attack Mother   ? Heart attack Brother 44  ? Cancer - Other Father   ? Diabetes Sister   ? Diabetes Sister   ? ?No Known Allergies ?  ? ?Review of Systems  ?Constitutional:  Negative for weight loss.  ?HENT: Negative.    ?Eyes: Negative.   ?Respiratory:  Positive for shortness of breath.   ?Cardiovascular:  Negative for chest pain.  ?Gastrointestinal: Negative.   ?Genitourinary: Negative.   ?Musculoskeletal: Negative.   ?Skin: Negative.   ?Neurological:  Positive for dizziness. Negative for loss of consciousness and headaches.  ?Endo/Heme/Allergies: Negative.   ?Psychiatric/Behavioral: Negative.    ? ?  ?Objective:  ?  ? ?BP 129/88 (BP Location: Left Arm,  Patient Position: Sitting, Cuff Size: Normal)   Pulse 88   Temp 98.2 ?F (36.8 ?C) (Oral)   Resp 18   Ht 5\' 10"  (1.778 m)   Wt 128 lb (58.1 kg)   SpO2 97%   BMI 18.37 kg/m?  ?BP Readings from Last 3 Encounters:  ?12/09/21 129/88  ?06/12/21 122/82  ?12/05/20 120/85  ? ?Wt Readings from Last 3 Encounters:  ?12/09/21 128 lb (58.1 kg)  ?06/12/21 127 lb 9.6 oz (57.9 kg)  ?12/05/20 131 lb (59.4 kg)  ? ?  ? ?Physical Exam ?Vitals and nursing note reviewed.  ?Constitutional:   ?   Appearance: Normal appearance.  ?HENT:  ?   Head: Normocephalic and atraumatic.  ?   Right Ear: External ear normal.  ?   Left Ear: External ear normal.  ?   Nose: Nose normal.  ?   Mouth/Throat:  ?   Mouth: Mucous membranes are moist.  ?   Pharynx: Oropharynx is clear.  ?Eyes:  ?   Extraocular Movements: Extraocular movements intact.  ?   Conjunctiva/sclera: Conjunctivae normal.  ?   Pupils: Pupils are equal, round, and reactive to light.  ?Cardiovascular:  ?   Rate and Rhythm: Normal rate and regular rhythm.  ?  Pulses: Normal pulses.  ?   Heart sounds: Normal heart sounds.  ?Pulmonary:  ?   Effort: Pulmonary effort is normal.  ?   Breath sounds: Normal breath sounds.  ?Musculoskeletal:     ?   General: Normal range of motion.  ?   Cervical back: Neck supple.  ?Lymphadenopathy:  ?   Cervical: Cervical adenopathy present.  ?   Right cervical: Deep cervical adenopathy present.  ?   Comments: One slightly enlarged node noted, non tender, mobile  ?Skin: ?   General: Skin is warm and dry.  ?Neurological:  ?   General: No focal deficit present.  ?   Mental Status: He is alert and oriented to person, place, and time.  ?Psychiatric:     ?   Mood and Affect: Mood normal.     ?   Behavior: Behavior normal.     ?   Thought Content: Thought content normal.     ?   Judgment: Judgment normal.  ? ? ? ?  ?Assessment & Plan:  ? ?Problem List Items Addressed This Visit   ?None ?Visit Diagnoses   ? ? Dizziness and giddiness    -  Primary  ? Relevant Orders   ? CBC with Differential/Platelet  ? Comp. Metabolic Panel (12)  ? TSH  ? Lymph node enlargement      ? ?  ? ?1. Dizziness and giddiness ?Patient education given on supportive care, encourage patient to increase water intake, work on reducing stress.  Patient encouraged to check blood pressure if these episodes happen again. ? ?Red flags given for prompt reevaluation. ?- CBC with Differential/Platelet ?- Comp. Metabolic Panel (12) ?- TSH ? ?2. Lymph node enlargement ?Consider imaging based on today's lab results. ? ? ?I have reviewed the patient's medical history (PMH, PSH, Social History, Family History, Medications, and allergies) , and have been updated if relevant. I spent 30 minutes reviewing chart and  face to face time with patient. ? ? ? ?Return if symptoms worsen or fail to improve.  ? ? ?Maximiliano Cromartie S Mayers, PA-C ? ?

## 2021-12-09 NOTE — Patient Instructions (Signed)
I do encourage you to work on increasing your hydration, you should be drinking at least 64 ounces of water a day.  I encourage you to work on stress reduction especially when you are driving to work. ? ?We will call you with today's lab results ? ?Kennieth Rad, PA-C ?Physician Assistant ?[facility] ?[email or web address] ? ? ?Dizziness ?Dizziness is a common problem. It is a feeling of unsteadiness or light-headedness. You may feel like you are about to faint. Dizziness can lead to injury if you stumble or fall. Anyone can become dizzy, but dizziness is more common in older adults. This condition can be caused by a number of things, including medicines, dehydration, or illness. ?Follow these instructions at home: ?Eating and drinking ? ?Drink enough fluid to keep your urine pale yellow. This helps to keep you from becoming dehydrated. Try to drink more clear fluids, such as water. ?Do not drink alcohol. ?Limit your caffeine intake if told to do so by your health care provider. Check ingredients and nutrition facts to see if a food or beverage contains caffeine. ?Limit your salt (sodium) intake if told to do so by your health care provider. Check ingredients and nutrition facts to see if a food or beverage contains sodium. ?Activity ? ?Avoid making quick movements. ?Rise slowly from chairs and steady yourself until you feel okay. ?In the morning, first sit up on the side of the bed. When you feel okay, stand slowly while you hold onto something until you know that your balance is good. ?If you need to stand in one place for a long time, move your legs often. Tighten and relax the muscles in your legs while you are standing. ?Do not drive or use machinery if you feel dizzy. ?Avoid bending down if you feel dizzy. Place items in your home so that they are easy for you to reach without leaning over. ?Lifestyle ?Do not use any products that contain nicotine or  tobacco. These products include cigarettes, chewing tobacco, and vaping devices, such as e-cigarettes. If you need help quitting, ask your health care provider. ?Try to reduce your stress level by using methods such as yoga or meditation. Talk with your health care provider if you need help to manage your stress. ?General instructions ?Watch your dizziness for any changes. ?Take over-the-counter and prescription medicines only as told by your health care provider. Talk with your health care provider if you think that your dizziness is caused by a medicine that you are taking. ?Tell a friend or a family member that you are feeling dizzy. If he or she notices any changes in your behavior, have this person call your health care provider. ?Keep all follow-up visits. This is important. ?Contact a health care provider if: ?Your dizziness does not go away or you have new symptoms. ?Your dizziness or light-headedness gets worse. ?You feel nauseous. ?You have reduced hearing. ?You have a fever. ?You have neck pain or a stiff neck. ?Your dizziness leads to an injury or a fall. ?Get help right away if: ?You vomit or have diarrhea and are unable to eat or drink anything. ?You have problems talking, walking, swallowing, or using your arms, hands, or legs. ?You feel generally weak. ?You have any bleeding. ?You are not thinking clearly or you have trouble forming sentences. It may take a friend or family member to notice this. ?You have chest pain, abdominal pain, shortness of breath, or sweating. ?Your vision changes or you develop a severe headache. ?  These symptoms may represent a serious problem that is an emergency. Do not wait to see if the symptoms will go away. Get medical help right away. Call your local emergency services (911 in the [place] Do not drive yourself to the hospital. ?Summary ?Dizziness is a feeling of unsteadiness or light-headedness. This condition can be caused by a number of things, including medicines,  dehydration, or illness. ?Anyone can become dizzy, but dizziness is more common in older adults. ?Drink enough fluid to keep your urine pale yellow. Do not drink alcohol. ?Avoid making quick movements if you feel dizzy. Monitor your dizziness for any changes. ?This information is not intended to replace advice given to you by your health care provider. Make sure you discuss any questions you have with your health care provider. ?Document Revised: 07/15/2020 Document Reviewed: 07/15/2020 ?Elsevier Patient Education ? [employer]. ? ?Managing Stress, Adult ?Feeling a certain amount of stress is normal. Stress helps our body and mind get ready to deal with the demands of life. Stress hormones can motivate you to do well at work and meet your responsibilities. But severe or long-term (chronic) stress can affect your mental and physical health. Chronic stress puts you at higher risk for: ?Anxiety and depression. ?Other health problems such as digestive problems, muscle aches, heart disease, high blood pressure, and stroke. ?What are the causes? ?Common causes of stress include: ?Demands from work, such as deadlines, feeling overworked, or having long hours. ?Pressures at home, such as money issues, disagreements with a spouse, or parenting issues. ?Pressures from major life changes, such as divorce, moving, loss of a loved one, or chronic illness. ?You may be at higher risk for stress-related problems if you: ?Do not get enough sleep. ?Are in poor health. ?Do not have emotional support. ?Have a mental health disorder such as anxiety or depression. ?How to recognize stress ?Stress can make you: ?Have trouble sleeping. ?Feel sad, anxious, irritable, or overwhelmed. ?Lose your appetite. ?Overeat or want to eat unhealthy foods. ?Want to use drugs or alcohol. ?Stress can also cause physical symptoms, such as: ?Sore, tense muscles, especially in the shoulders and neck. ?Headaches. ?Trouble breathing. ?A faster heart  rate. ?Stomach pain, nausea, or vomiting. ?Diarrhea or constipation. ?Trouble concentrating. ?Follow these instructions at home: ?Eating and drinking ?Eat a healthy diet. This includes: ?Eating foods that are high in fiber, such as beans, whole grains, and fresh fruits and vegetables. ?Limiting foods that are high in fat and processed sugars, such as fried or sweet foods. ?Do not skip meals or overeat. ?Drink enough fluid to keep your urine pale yellow. ?Alcohol use ?Do not drink alcohol if: ?Your health care provider tells you not to drink. ?You are pregnant, may be pregnant, or are planning to become pregnant. ?Drinking alcohol is a way some people try to ease their stress. This can be dangerous, so if you drink alcohol: ?Limit how much you have to: ?0-1 drink a day for women. ?0-2 drinks a day for men. ?Know how much alcohol is in your drink. In the [place] one drink equals one 12 oz bottle of beer (355 mL), one 5 oz glass of wine (148 mL), or one 1? oz glass of hard liquor (44 mL). ?Activity ? ?Include 30 minutes of exercise in your daily schedule. Exercise is a good stress reducer. ?Include time in your day for an activity that you find relaxing. Try taking a walk, going on a bike ride, reading a book, or listening to music. ?Schedule  your time in a way that lowers stress, and keep a regular schedule. Focus on doing what is most important to get done. ?Lifestyle ?Identify the source of your stress and your reaction to it. See a therapist who can help you change unhelpful reactions. ?When there are stressful events: ?Talk about them with family, friends, or coworkers. ?Try to think realistically about stressful events and not ignore them or overreact. ?Try to find the positives in a stressful situation and not focus on the negatives. ?Cut back on responsibilities at work and home, if possible. Ask for help from friends or family members if you need it. ?Find ways to manage stress, such as: ?Mindfulness,  meditation, or deep breathing. ?Yoga or tai chi. ?Progressive muscle relaxation. ?Spending time in nature. ?Doing art, playing music, or reading. ?Making time for fun activities. ?Spending time with family and friends. ?G

## 2021-12-10 LAB — CBC WITH DIFFERENTIAL/PLATELET
Basophils Absolute: 0.1 10*3/uL (ref 0.0–0.2)
Basos: 1 %
EOS (ABSOLUTE): 0.4 10*3/uL (ref 0.0–0.4)
Eos: 5 %
Hematocrit: 40.3 % (ref 37.5–51.0)
Hemoglobin: 13.4 g/dL (ref 13.0–17.7)
Immature Grans (Abs): 0 10*3/uL (ref 0.0–0.1)
Immature Granulocytes: 0 %
Lymphocytes Absolute: 2.2 10*3/uL (ref 0.7–3.1)
Lymphs: 26 %
MCH: 32.1 pg (ref 26.6–33.0)
MCHC: 33.3 g/dL (ref 31.5–35.7)
MCV: 96 fL (ref 79–97)
Monocytes Absolute: 0.7 10*3/uL (ref 0.1–0.9)
Monocytes: 8 %
Neutrophils Absolute: 4.9 10*3/uL (ref 1.4–7.0)
Neutrophils: 60 %
Platelets: 149 10*3/uL — ABNORMAL LOW (ref 150–450)
RBC: 4.18 x10E6/uL (ref 4.14–5.80)
RDW: 12.8 % (ref 11.6–15.4)
WBC: 8.2 10*3/uL (ref 3.4–10.8)

## 2021-12-10 LAB — COMP. METABOLIC PANEL (12)
AST: 19 IU/L (ref 0–40)
Albumin/Globulin Ratio: 1.7 (ref 1.2–2.2)
Albumin: 4.7 g/dL (ref 3.8–4.9)
Alkaline Phosphatase: 81 IU/L (ref 44–121)
BUN/Creatinine Ratio: 14 (ref 9–20)
BUN: 13 mg/dL (ref 6–24)
Bilirubin Total: 0.4 mg/dL (ref 0.0–1.2)
Calcium: 9.5 mg/dL (ref 8.7–10.2)
Chloride: 104 mmol/L (ref 96–106)
Creatinine, Ser: 0.95 mg/dL (ref 0.76–1.27)
Globulin, Total: 2.7 g/dL (ref 1.5–4.5)
Glucose: 91 mg/dL (ref 70–99)
Potassium: 4.7 mmol/L (ref 3.5–5.2)
Sodium: 142 mmol/L (ref 134–144)
Total Protein: 7.4 g/dL (ref 6.0–8.5)
eGFR: 92 mL/min/{1.73_m2} (ref >59)

## 2021-12-10 LAB — TSH: TSH: 1.67 u[IU]/mL (ref 0.450–4.500)

## 2021-12-11 ENCOUNTER — Other Ambulatory Visit: Payer: Self-pay

## 2021-12-11 ENCOUNTER — Other Ambulatory Visit: Payer: Self-pay | Admitting: Cardiovascular Disease

## 2021-12-11 MED ORDER — ROSUVASTATIN CALCIUM 20 MG PO TABS
20.0000 mg | ORAL_TABLET | Freq: Every day | ORAL | 3 refills | Status: DC
  Filled 2021-12-11 – 2021-12-18 (×2): qty 30, 30d supply, fill #0
  Filled 2022-02-02: qty 30, 30d supply, fill #1
  Filled 2022-03-13: qty 30, 30d supply, fill #2
  Filled 2022-04-17 (×2): qty 30, 30d supply, fill #3
  Filled 2022-05-19: qty 30, 30d supply, fill #4
  Filled 2022-07-06: qty 30, 30d supply, fill #5
  Filled 2022-09-03: qty 30, 30d supply, fill #6
  Filled 2022-10-15: qty 30, 30d supply, fill #7
  Filled 2022-12-10: qty 90, 90d supply, fill #8

## 2021-12-11 NOTE — Telephone Encounter (Signed)
Pt seen in person 12/09/2021 ?

## 2021-12-12 ENCOUNTER — Telehealth: Payer: Self-pay | Admitting: *Deleted

## 2021-12-12 NOTE — Telephone Encounter (Signed)
-----   Message from Roney Jaffe, PA-C sent at 12/10/2021  1:10 PM EDT ----- ?Please call patient and let him know that his thyroid function, kidney function and liver function are within normal limits.  He does not show signs of anemia.  His labs do not speak to any reason for his episodes of dizziness.  Continue with current treatment plan ?

## 2021-12-12 NOTE — Telephone Encounter (Signed)
MA informed patient of results per signed DPR via VM. ?Voicemail states to give a call back to [place] with [facility] at [phone]. ?

## 2021-12-18 ENCOUNTER — Other Ambulatory Visit: Payer: Self-pay

## 2022-01-01 NOTE — Progress Notes (Deleted)
Cardiology Office Note:   Date:  01/01/2022  NAME:  Nicholas Espinoza    MRN: [id number] DOB:  March 02, 1961   PCP:  Gildardo Pounds, NP  Cardiologist:  None  Electrophysiologist:  None   Referring MD: Gildardo Pounds, NP   No chief complaint on file. ***  History of Present Illness:   Nicholas Espinoza is a 61 y.o. male with a hx of PAD, non-obstructive CAD, brugada pattern, no symptoms who presents for follow-up.   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 Tobacco abuse  3. PAD -90% mid left common iliac stenosis -> stenting 09/02/2020 4. Non-obstructive CAD -CAC score 53 (63rd percentile) -<25% LAD 5. HLD -T chol 169, HDL 54, LDL 100, TG 79  Past Medical History: Past Medical History:  Diagnosis Date   Left ventricular diastolic dysfunction, NYHA class 1     Past Surgical History: Past Surgical History:  Procedure Laterality Date   ABDOMINAL AORTOGRAM W/LOWER EXTREMITY Bilateral 09/02/2020   Procedure: ABDOMINAL AORTOGRAM W/LOWER EXTREMITY;  Surgeon: Lorretta Harp, MD;  Location: [facility];  Service: Cardiovascular;  Laterality: Bilateral;   DENTAL SURGERY     PERIPHERAL VASCULAR ATHERECTOMY Left 09/02/2020   Procedure: PERIPHERAL VASCULAR ATHERECTOMY;  Surgeon: Lorretta Harp, MD;  Location: [facility];  Service: Cardiovascular;  Laterality: Left;   PERIPHERAL VASCULAR INTERVENTION Left 09/02/2020   Procedure: PERIPHERAL VASCULAR INTERVENTION;  Surgeon: Lorretta Harp, MD;  Location: [facility];  Service: Cardiovascular;  Laterality: Left;    Current Medications: No outpatient medications have been marked as taking for the 01/02/22 encounter (Appointment) with Geralynn Rile, MD.   Current Facility-Administered Medications for the 01/02/22 encounter (Appointment) with Geralynn Rile, MD  Medication   sodium chloride flush (NS) 0.9 % injection 3 mL     Allergies:    Patient has no known  allergies.   Social History: Social History   Socioeconomic History   Marital status: Single    Spouse name: Not on file   Number of children: 4   Years of education: Not on file   Highest education level: Not on file  Occupational History   Occupation: unemployed  Tobacco Use   Smoking status: Some Days    Packs/day: 25.00    Years: 0.50    Pack years: 12.50    Types: Cigarettes   Smokeless tobacco: Never   Tobacco comments:    less then 1 cigarette daily  Vaping Use   Vaping Use: Never used  Substance and Sexual Activity   Alcohol use: Yes   Drug use: Yes    Types: Marijuana   Sexual activity: Yes  Other Topics Concern   Not on file  Social History Narrative   Not on file   Social Determinants of Health   Financial Resource Strain: Not on file  Food Insecurity: Not on file  Transportation Needs: Not on file  Physical Activity: Not on file  Stress: Not on file  Social Connections: Not on file     Family History: The patient's ***family history includes Cancer - Other in his father; Diabetes in his sister and sister; Heart attack in his mother; Heart attack (age of onset: 16) in his brother.  ROS:   All other ROS reviewed and negative. Pertinent positives noted in the HPI.     EKGs/Labs/Other Studies Reviewed:   The following studies were personally reviewed by me today:  EKG:  EKG is *** ordered today.  The ekg ordered today demonstrates ***, and was personally reviewed by me.   CCTA 07/08/2020   IMPRESSION: 1.  Calcium score 53 which is 63 rd percentile for age / sex   2.   Normal aortic root 3.5 cm   3.   CAD RADS 1 non-obstructive CAD in LAD  Recent Labs: 12/09/2021: BUN 13; Creatinine, Ser 0.95; Hemoglobin 13.4; Platelets 149; Potassium 4.7; Sodium 142; TSH 1.670   Recent Lipid Panel    Component Value Date/Time   CHOL 169 05/01/2020 0951   TRIG 79 05/01/2020 0951   HDL 54 05/01/2020 0951   CHOLHDL 3.1 05/01/2020 0951   LDLCALC 100 (H)  05/01/2020 0951    Physical Exam:   VS:  There were no vitals taken for this visit.   Wt Readings from Last 3 Encounters:  12/09/21 128 lb (58.1 kg)  06/12/21 127 lb 9.6 oz (57.9 kg)  12/05/20 131 lb (59.4 kg)    General: Well nourished, well developed, in no acute distress Head: Atraumatic, normal size  Eyes: PEERLA, EOMI  Neck: Supple, no JVD Endocrine: No thryomegaly Cardiac: Normal S1, S2; RRR; no murmurs, rubs, or gallops Lungs: Clear to auscultation bilaterally, no wheezing, rhonchi or rales  Abd: Soft, nontender, no hepatomegaly  Ext: No edema, pulses 2+ Musculoskeletal: No deformities, BUE and BLE strength normal and equal Skin: Warm and dry, no rashes   Neuro: Alert and oriented to person, place, time, and situation, CNII-XII grossly intact, no focal deficits  Psych: Normal mood and affect   ASSESSMENT:   Nicholas Espinoza is a 61 y.o. male who presents for the following: No diagnosis found.  PLAN:   There are no diagnoses linked to this encounter.  {Are you ordering a CV Procedure (e.g. stress test, cath, DCCV, TEE, etc)?   Press F2        :[id number]  Disposition: No follow-ups on file.  Medication Adjustments/Labs and Tests Ordered: Current medicines are reviewed at length with the patient today.  Concerns regarding medicines are outlined above.  No orders of the defined types were placed in this encounter.  No orders of the defined types were placed in this encounter.   There are no Patient Instructions on file for this visit.   Time Spent with Patient: I have spent a total of *** minutes with patient reviewing hospital notes, telemetry, EKGs, labs and examining the patient as well as establishing an assessment and plan that was discussed with the patient.  > 50% of time was spent in direct patient care.  Signed, Addison Naegeli. Audie Box, MD, [facility]  [address] [place], [place] [address] [phone]  01/01/2022 9:02 AM

## 2022-01-02 ENCOUNTER — Ambulatory Visit: Payer: Self-pay | Admitting: Cardiovascular Disease

## 2022-01-02 DIAGNOSIS — I739 Peripheral vascular disease, unspecified: Secondary | ICD-10-CM

## 2022-01-02 DIAGNOSIS — I2510 Atherosclerotic heart disease of native coronary artery without angina pectoris: Secondary | ICD-10-CM

## 2022-01-02 DIAGNOSIS — E782 Mixed hyperlipidemia: Secondary | ICD-10-CM

## 2022-01-02 DIAGNOSIS — R931 Abnormal findings on diagnostic imaging of heart and coronary circulation: Secondary | ICD-10-CM

## 2022-01-06 ENCOUNTER — Encounter: Payer: Self-pay | Admitting: Cardiovascular Disease

## 2022-02-02 ENCOUNTER — Other Ambulatory Visit: Payer: Self-pay

## 2022-03-04 ENCOUNTER — Other Ambulatory Visit: Payer: Self-pay

## 2022-03-04 ENCOUNTER — Encounter: Payer: Self-pay | Admitting: Nurse Practitioner

## 2022-03-04 ENCOUNTER — Ambulatory Visit: Payer: Commercial Managed Care - HMO | Attending: Nurse Practitioner | Admitting: Nurse Practitioner

## 2022-03-04 VITALS — BP 118/86 | HR 80 | Temp 98.3°F | Resp 16 | Wt 131.0 lb

## 2022-03-04 DIAGNOSIS — D696 Thrombocytopenia, unspecified: Secondary | ICD-10-CM | POA: Insufficient documentation

## 2022-03-04 DIAGNOSIS — Z1211 Encounter for screening for malignant neoplasm of colon: Secondary | ICD-10-CM

## 2022-03-04 DIAGNOSIS — M5441 Lumbago with sciatica, right side: Secondary | ICD-10-CM | POA: Diagnosis not present

## 2022-03-04 DIAGNOSIS — R42 Dizziness and giddiness: Secondary | ICD-10-CM | POA: Diagnosis not present

## 2022-03-04 MED ORDER — GABAPENTIN 300 MG PO CAPS
ORAL_CAPSULE | Freq: Three times a day (TID) | ORAL | 3 refills | Status: DC
  Filled 2022-03-04: qty 90, 30d supply, fill #0

## 2022-03-04 NOTE — Progress Notes (Signed)
Assessment & Plan:  Nicholas Espinoza was seen today for dizziness.  Diagnoses and all orders for this visit:  Dizziness  Colon cancer screening -     Ambulatory referral to Gastroenterology  Acute right-sided low back pain with right-sided sciatica -     gabapentin (NEURONTIN) 300 MG capsule; TAKE 1 CAPSULE (300 MG TOTAL) BY MOUTH 3 (THREE) TIMES DAILY.    Patient has been counseled on age-appropriate routine health concerns for screening and prevention. These are reviewed and up-to-date. Referrals have been placed accordingly. Immunizations are up-to-date or declined.    Subjective:   Chief Complaint  Patient presents with   Dizziness   Dizziness Pertinent negatives include no chest pain, coughing, fever, headaches, myalgias, nausea or vomiting.   Nicholas Espinoza 61 y.o. male presents to office today for follow up to Dizziness.   He has a past medical history of Left ventricular diastolic dysfunction, NYHA class 1.   He was evaluated for dizziness a few months ago. Labs benign (TSH normal, CMP normal, CBC at baseline). Today reports symptoms have completely resolved. Doing well today without any concerns.   Declines vaccines today. Will schedule for physical later this year.   Referral made to GI for colonoscopy.   Review of Systems  Constitutional:  Negative for fever, malaise/fatigue and weight loss.  HENT: Negative.  Negative for nosebleeds.   Eyes: Negative.  Negative for blurred vision, double vision and photophobia.  Respiratory: Negative.  Negative for cough and shortness of breath.   Cardiovascular: Negative.  Negative for chest pain, palpitations and leg swelling.  Gastrointestinal: Negative.  Negative for heartburn, nausea and vomiting.  Musculoskeletal: Negative.  Negative for myalgias.  Neurological:  Positive for dizziness. Negative for focal weakness, seizures and headaches.  Psychiatric/Behavioral: Negative.  Negative for suicidal ideas.     Past Medical  History:  Diagnosis Date   Left ventricular diastolic dysfunction, NYHA class 1     Past Surgical History:  Procedure Laterality Date   ABDOMINAL AORTOGRAM W/LOWER EXTREMITY Bilateral 09/02/2020   Procedure: ABDOMINAL AORTOGRAM W/LOWER EXTREMITY;  Surgeon: Runell Gess, MD;  Location: [facility];  Service: Cardiovascular;  Laterality: Bilateral;   DENTAL SURGERY     PERIPHERAL VASCULAR ATHERECTOMY Left 09/02/2020   Procedure: PERIPHERAL VASCULAR ATHERECTOMY;  Surgeon: Runell Gess, MD;  Location: [facility];  Service: Cardiovascular;  Laterality: Left;   PERIPHERAL VASCULAR INTERVENTION Left 09/02/2020   Procedure: PERIPHERAL VASCULAR INTERVENTION;  Surgeon: Runell Gess, MD;  Location: [facility];  Service: Cardiovascular;  Laterality: Left;    Family History  Problem Relation Age of Onset   Heart attack Mother    Heart attack Brother 38   Cancer - Other Father    Diabetes Sister    Diabetes Sister     Social History Reviewed with no changes to be made today.   Outpatient Medications Prior to Visit  Medication Sig Dispense Refill   aspirin EC 81 MG tablet Take 81 mg by mouth daily. Swallow whole.     clopidogrel (PLAVIX) 75 MG tablet Take 1 tablet (75 mg total) by mouth daily with breakfast. 90 tablet 4   rosuvastatin (CRESTOR) 20 MG tablet Take 1 tablet (20 mg total) by mouth daily. 90 tablet 3   gabapentin (NEURONTIN) 300 MG capsule TAKE 1 CAPSULE (300 MG TOTAL) BY MOUTH 3 (THREE) TIMES DAILY. 90 capsule 3   Facility-Administered Medications Prior to Visit  Medication Dose Route Frequency Provider Last Rate  Last Admin   sodium chloride flush (NS) 0.9 % injection 3 mL  3 mL Intravenous Q12H Runell Gess, MD        No Known Allergies     Objective:    BP 118/86 (BP Location: Left Arm, Patient Position: Sitting, Cuff Size: Normal)   Pulse 80   Temp 98.3 F (36.8 C) (Oral)   Resp 16   Wt 131 lb (59.4 kg)   SpO2 99%   BMI 18.80  kg/m  Wt Readings from Last 3 Encounters:  03/04/22 131 lb (59.4 kg)  12/09/21 128 lb (58.1 kg)  06/12/21 127 lb 9.6 oz (57.9 kg)    Physical Exam Vitals and nursing note reviewed.  Constitutional:      Appearance: He is well-developed.  HENT:     Head: Normocephalic and atraumatic.  Cardiovascular:     Rate and Rhythm: Normal rate and regular rhythm.     Heart sounds: Normal heart sounds. No murmur heard.    No friction rub. No gallop.  Pulmonary:     Effort: Pulmonary effort is normal. No tachypnea or respiratory distress.     Breath sounds: Normal breath sounds. No decreased breath sounds, wheezing, rhonchi or rales.  Chest:     Chest wall: No tenderness.  Abdominal:     General: Bowel sounds are normal.     Palpations: Abdomen is soft.  Musculoskeletal:        General: Normal range of motion.     Cervical back: Normal range of motion.  Skin:    General: Skin is warm and dry.  Neurological:     Mental Status: He is alert and oriented to person, place, and time.     Coordination: Coordination normal.  Psychiatric:        Behavior: Behavior normal. Behavior is cooperative.        Thought Content: Thought content normal.        Judgment: Judgment normal.          Patient has been counseled extensively about nutrition and exercise as well as the importance of adherence with medications and regular follow-up. The patient was given clear instructions to go to ER or return to medical center if symptoms don't improve, worsen or new problems develop. The patient verbalized understanding.   Follow-up: Return for physical in 3-6 months.   Claiborne Rigg, FNP-BC [facility] [place], [place] [phone]   03/04/2022, 3:15 PM

## 2022-03-13 ENCOUNTER — Other Ambulatory Visit: Payer: Self-pay

## 2022-04-06 ENCOUNTER — Other Ambulatory Visit (HOSPITAL_COMMUNITY): Payer: Self-pay | Admitting: Cardiovascular Disease

## 2022-04-06 DIAGNOSIS — I739 Peripheral vascular disease, unspecified: Secondary | ICD-10-CM

## 2022-04-06 DIAGNOSIS — Z95828 Presence of other vascular implants and grafts: Secondary | ICD-10-CM

## 2022-04-09 ENCOUNTER — Ambulatory Visit (HOSPITAL_COMMUNITY): Admission: RE | Admit: 2022-04-09 | Payer: Commercial Managed Care - HMO | Source: Ambulatory Visit

## 2022-04-17 ENCOUNTER — Other Ambulatory Visit: Payer: Self-pay

## 2022-04-22 ENCOUNTER — Ambulatory Visit (HOSPITAL_COMMUNITY)
Admission: RE | Admit: 2022-04-22 | Payer: Commercial Managed Care - HMO | Source: Ambulatory Visit | Attending: Cardiovascular Disease | Admitting: Cardiovascular Disease

## 2022-04-24 ENCOUNTER — Encounter (HOSPITAL_COMMUNITY): Payer: Self-pay

## 2022-05-19 ENCOUNTER — Other Ambulatory Visit: Payer: Self-pay

## 2022-06-05 ENCOUNTER — Encounter: Payer: Commercial Managed Care - HMO | Admitting: Nurse Practitioner

## 2022-07-06 ENCOUNTER — Other Ambulatory Visit: Payer: Self-pay

## 2022-07-06 ENCOUNTER — Encounter: Payer: Commercial Managed Care - HMO | Admitting: Nurse Practitioner

## 2022-07-07 ENCOUNTER — Other Ambulatory Visit: Payer: Self-pay

## 2022-08-06 ENCOUNTER — Other Ambulatory Visit: Payer: Self-pay

## 2022-09-01 ENCOUNTER — Encounter: Payer: Commercial Managed Care - HMO | Admitting: Nurse Practitioner

## 2022-09-03 ENCOUNTER — Other Ambulatory Visit: Payer: Self-pay | Admitting: Cardiovascular Disease

## 2022-09-03 ENCOUNTER — Other Ambulatory Visit: Payer: Self-pay

## 2022-09-03 MED ORDER — CLOPIDOGREL BISULFATE 75 MG PO TABS
75.0000 mg | ORAL_TABLET | Freq: Every day | ORAL | 4 refills | Status: DC
  Filled 2022-09-03: qty 90, 90d supply, fill #0
  Filled 2023-01-01: qty 90, 90d supply, fill #1
  Filled 2023-04-23: qty 90, 90d supply, fill #2
  Filled 2023-08-03: qty 90, 90d supply, fill #3

## 2022-10-19 ENCOUNTER — Other Ambulatory Visit: Payer: Self-pay

## 2022-11-24 ENCOUNTER — Other Ambulatory Visit: Payer: Self-pay

## 2022-11-24 ENCOUNTER — Emergency Department (HOSPITAL_COMMUNITY)
Admission: EM | Admit: 2022-11-24 | Discharge: 2022-11-25 | Disposition: A | Discharge status: Home / Self Care | Payer: Self-pay | Attending: Emergency Medicine | Admitting: Emergency Medicine

## 2022-11-24 DIAGNOSIS — N433 Hydrocele, unspecified: Secondary | ICD-10-CM

## 2022-11-24 DIAGNOSIS — Z7902 Long term (current) use of antithrombotics/antiplatelets: Secondary | ICD-10-CM | POA: Insufficient documentation

## 2022-11-24 DIAGNOSIS — R933 Abnormal findings on diagnostic imaging of other parts of digestive tract: Secondary | ICD-10-CM

## 2022-11-24 DIAGNOSIS — R110 Nausea: Secondary | ICD-10-CM | POA: Insufficient documentation

## 2022-11-24 DIAGNOSIS — Z7982 Long term (current) use of aspirin: Secondary | ICD-10-CM | POA: Insufficient documentation

## 2022-11-24 DIAGNOSIS — K529 Noninfective gastroenteritis and colitis, unspecified: Secondary | ICD-10-CM

## 2022-11-24 DIAGNOSIS — R1084 Generalized abdominal pain: Secondary | ICD-10-CM | POA: Insufficient documentation

## 2022-11-24 LAB — COMPREHENSIVE METABOLIC PANEL
ALT: 19 U/L (ref 0–44)
AST: 19 U/L (ref 15–41)
Albumin: 3.6 g/dL (ref 3.5–5.0)
Alkaline Phosphatase: 65 U/L (ref 38–126)
Anion gap: 6 (ref 5–15)
BUN: 15 mg/dL (ref 6–20)
CO2: 24 mmol/L (ref 22–32)
Calcium: 8.7 mg/dL — ABNORMAL LOW (ref 8.9–10.3)
Chloride: 106 mmol/L (ref 98–111)
Creatinine, Ser: 0.94 mg/dL (ref 0.61–1.24)
GFR, Estimated: >60 mL/min (ref >60)
Glucose, Bld: 133 mg/dL — ABNORMAL HIGH (ref 70–99)
Potassium: 3.4 mmol/L — ABNORMAL LOW (ref 3.5–5.1)
Sodium: 136 mmol/L (ref 135–145)
Total Bilirubin: 0.7 mg/dL (ref 0.3–1.2)
Total Protein: 6.9 g/dL (ref 6.5–8.1)

## 2022-11-24 LAB — URINALYSIS, ROUTINE W REFLEX MICROSCOPIC
Bilirubin Urine: NEGATIVE
Glucose, UA: NEGATIVE mg/dL
Ketones, ur: NEGATIVE mg/dL
Leukocytes,Ua: NEGATIVE
Nitrite: NEGATIVE
Protein, ur: NEGATIVE mg/dL
Specific Gravity, Urine: 1.023 (ref 1.005–1.030)
pH: 5 (ref 5.0–8.0)

## 2022-11-24 LAB — CBC
HCT: 36.4 % — ABNORMAL LOW (ref 39.0–52.0)
Hemoglobin: 12.4 g/dL — ABNORMAL LOW (ref 13.0–17.0)
MCH: 31.7 pg (ref 26.0–34.0)
MCHC: 34.1 g/dL (ref 30.0–36.0)
MCV: 93.1 fL (ref 80.0–100.0)
Platelets: 155 10*3/uL (ref 150–400)
RBC: 3.91 MIL/uL — ABNORMAL LOW (ref 4.22–5.81)
RDW: 14 % (ref 11.5–15.5)
WBC: 8.4 10*3/uL (ref 4.0–10.5)
nRBC: 0 % (ref 0.0–0.2)

## 2022-11-24 LAB — LIPASE, BLOOD: Lipase: 82 U/L — ABNORMAL HIGH (ref 11–51)

## 2022-11-24 NOTE — ED Triage Notes (Signed)
Pt with generalized abdominal pain since Sunday. Denies n/v/d or dysuria.

## 2022-11-24 NOTE — ED Provider Notes (Signed)
[facility] Provider Note   CSN: [id number] Arrival date & time: 11/24/22  1807     History  Chief Complaint  Patient presents with   Abdominal Pain    Nicholas Espinoza is a 62 y.o. male with history of peripheral vascular disease and sciatica who presents the emergency department complaining of generalized abdominal pain for the past 3 days.  Patient localizes pain more so to the periumbilical area.  Has been feeling rundown, with decreased appetite and some nausea.  Denies any vomiting, diarrhea, urinary symptoms.  Pain does not radiate.  No fever or chills.   Abdominal Pain Associated symptoms: nausea        Home Medications Prior to Admission medications   Medication Sig Start Date End Date Taking? Authorizing Provider  aspirin EC 81 MG tablet Take 81 mg by mouth daily. Swallow whole.    [provider]  clopidogrel (PLAVIX) 75 MG tablet Take 1 tablet (75 mg total) by mouth daily with breakfast. 09/03/22   Lorretta Harp, MD  gabapentin (NEURONTIN) 300 MG capsule TAKE 1 CAPSULE (300 MG TOTAL) BY MOUTH 3 (THREE) TIMES DAILY. 03/04/22 03/04/23  Gildardo Pounds, NP  rosuvastatin (CRESTOR) 20 MG tablet Take 1 tablet (20 mg total) by mouth daily. 12/11/21 11/18/22  O'NealCassie Freer, MD  albuterol (PROVENTIL HFA;VENTOLIN HFA) 108 (90 Base) MCG/ACT inhaler Inhale 2 puffs into the lungs every 6 (six) hours as needed for wheezing or shortness of breath. 07/11/16 04/08/20  Lacretia Leigh, MD      Allergies    Patient has no known allergies.    Review of Systems   Review of Systems  Constitutional:  Positive for appetite change.  Gastrointestinal:  Positive for abdominal pain and nausea.  All other systems reviewed and are negative.   Physical Exam Updated Vital Signs BP 113/87 (BP Location: Right Arm)   Pulse 60   Temp 98.3 F (36.8 C) (Oral)   Resp 16   SpO2 100%  Physical Exam Vitals and nursing note reviewed.   Constitutional:      Appearance: Normal appearance.  HENT:     Head: Normocephalic and atraumatic.  Eyes:     Conjunctiva/sclera: Conjunctivae normal.  Cardiovascular:     Rate and Rhythm: Normal rate and regular rhythm.  Pulmonary:     Effort: Pulmonary effort is normal. No respiratory distress.     Breath sounds: Normal breath sounds.  Abdominal:     General: There is no distension.     Palpations: Abdomen is soft.     Tenderness: There is generalized abdominal tenderness.  Skin:    General: Skin is warm and dry.  Neurological:     General: No focal deficit present.     Mental Status: He is alert.     ED Results / Procedures / Treatments   Labs (all labs ordered are listed, but only abnormal results are displayed) Labs Reviewed  LIPASE, BLOOD - Abnormal; Notable for the following components:      Result Value   Lipase 82 (*)    All other components within normal limits  COMPREHENSIVE METABOLIC PANEL - Abnormal; Notable for the following components:   Potassium 3.4 (*)    Glucose, Bld 133 (*)    Calcium 8.7 (*)    All other components within normal limits  CBC - Abnormal; Notable for the following components:   RBC 3.91 (*)    Hemoglobin 12.4 (*)  HCT 36.4 (*)    All other components within normal limits  URINALYSIS, ROUTINE W REFLEX MICROSCOPIC - Abnormal; Notable for the following components:   Hgb urine dipstick MODERATE (*)    Bacteria, UA RARE (*)    All other components within normal limits    EKG None  Radiology No results found.  Procedures Procedures    Medications Ordered in ED Medications - No data to display  ED Course/ Medical Decision Making/ A&P                             Medical Decision Making Amount and/or Complexity of Data Reviewed Labs: ordered. Radiology: ordered.   This patient is a 62 y.o. male  who presents to the ED for concern of abdominal pain.   Differential diagnoses prior to evaluation: The emergent  differential diagnosis includes, but is not limited to,  AAA, mesenteric ischemia, appendicitis, diverticulitis, DKA, gastroenteritis, nephrolithiasis, pancreatitis, constipation, UTI, bowel obstruction, biliary disease, IBD, PUD, hepatitis. This is not an exhaustive differential.   Past Medical History / Co-morbidities: peripheral vascular disease and sciatica  Physical Exam: Physical exam performed. The pertinent findings include: Normal vital signs, no acute distress.  Abdomen soft, with generalized tenderness, worse in the periumbilical region.  Lab Tests/Imaging studies: I personally interpreted labs/imaging and the pertinent results include: No leukocytosis, stable hemoglobin.  CMP grossly unremarkable.  Lipase 82, none prior to compare to.  Urinalysis with moderate hemoglobin.  CT pending at time of shift change.   Disposition: Patient discussed and care transferred to [facility] at shift change. Please see his/her note for further details regarding further ED course and disposition. Plan at time of handoff is follow up on CT imaging to determine disposition.   Final Clinical Impression(s) / ED Diagnoses Final diagnoses:  Generalized abdominal pain    Rx / DC Orders ED Discharge Orders     None      Portions of this report may have been transcribed using voice recognition software. Every effort was made to ensure accuracy; however, inadvertent computerized transcription errors may be present.    Nancee Brownrigg T, PA-C 11/24/22 2343    Elayne Snare K, DO 11/28/22 [phone]

## 2022-11-25 ENCOUNTER — Emergency Department (HOSPITAL_COMMUNITY): Payer: Self-pay

## 2022-11-25 ENCOUNTER — Other Ambulatory Visit: Payer: Self-pay

## 2022-11-25 ENCOUNTER — Other Ambulatory Visit: Payer: Self-pay | Admitting: Nurse Practitioner

## 2022-11-25 DIAGNOSIS — D649 Anemia, unspecified: Secondary | ICD-10-CM

## 2022-11-25 DIAGNOSIS — Z1211 Encounter for screening for malignant neoplasm of colon: Secondary | ICD-10-CM

## 2022-11-25 DIAGNOSIS — K529 Noninfective gastroenteritis and colitis, unspecified: Secondary | ICD-10-CM

## 2022-11-25 MED ORDER — CIPROFLOXACIN HCL 500 MG PO TABS
500.0000 mg | ORAL_TABLET | Freq: Two times a day (BID) | ORAL | 0 refills | Status: DC
  Filled 2022-11-25: qty 14, 7d supply, fill #0

## 2022-11-25 MED ORDER — DICYCLOMINE HCL 10 MG PO CAPS
10.0000 mg | ORAL_CAPSULE | Freq: Once | ORAL | Status: AC
  Administered 2022-11-25: 10 mg via ORAL
  Filled 2022-11-25: qty 1

## 2022-11-25 MED ORDER — METRONIDAZOLE 500 MG PO TABS
500.0000 mg | ORAL_TABLET | Freq: Two times a day (BID) | ORAL | 0 refills | Status: DC
  Filled 2022-11-25: qty 14, 7d supply, fill #0

## 2022-11-25 MED ORDER — CIPROFLOXACIN HCL 500 MG PO TABS
500.0000 mg | ORAL_TABLET | Freq: Once | ORAL | Status: AC
  Administered 2022-11-25: 500 mg via ORAL
  Filled 2022-11-25: qty 1

## 2022-11-25 MED ORDER — ACETAMINOPHEN 325 MG PO TABS
650.0000 mg | ORAL_TABLET | Freq: Once | ORAL | Status: AC
  Administered 2022-11-25: 650 mg via ORAL
  Filled 2022-11-25: qty 2

## 2022-11-25 MED ORDER — IOHEXOL 350 MG/ML SOLN
75.0000 mL | Freq: Once | INTRAVENOUS | Status: AC | PRN
  Administered 2022-11-25: 75 mL via INTRAVENOUS

## 2022-11-25 MED ORDER — METRONIDAZOLE 500 MG PO TABS
500.0000 mg | ORAL_TABLET | Freq: Once | ORAL | Status: AC
  Administered 2022-11-25: 500 mg via ORAL
  Filled 2022-11-25: qty 1

## 2022-11-25 NOTE — Discharge Instructions (Addendum)
You were evaluated in the Emergency Department and after careful evaluation, we did not find any emergent condition requiring admission or further testing in the hospital.  Your workup today revealed that you have an inflammation of the colon also known as colitis.  Will treat this with antibiotics.  Please take the antibiotics as directed until finished.  Take Tylenol or ibuprofen for pain.  Your CT scan also showed a irregularity along the wall of the rectum, this may represent some stool but also could be a mass.  Please follow-up with [facility] for colonoscopy, please call the phone number provided for them on your discharge paperwork and schedule an appointment.  Were also some lesions noted on your liver as well as your pelvic bone.  Please follow-up with your primary care doctor for follow-up on this.  Please return to the Emergency Department if you experience any worsening of your condition. Thank you for allowing [place] to be a part of your care.

## 2022-11-25 NOTE — ED Provider Notes (Signed)
Care of the patient received from [place] PA-C.  Please see her note for full HPI.  In short, 62 year old male who presents with generalized abdominal pain for last 3 days.  Workup in the ED reveals slightly elevated lipase but not consistent with pancreatitis, mild hypokalemia 3.4, no leukocytosis, normal LFTs, bilirubin.  Care signed out pending a CT abdomen of the pelvis  CT of the abdomen reviewed, agree with radiology read, findings as below  IMPRESSION:  1. Moderate wall thickening versus underdistention of the proximal  ascending colon. No inflammatory changes are seen. Correlate  clinically for infiltrating disease or colitis.  2. 2.8 x 1.3 cm irregularity along the ventral wall of the distal  rectum. Indeterminate whether this represents unaerated stool or a  mass. Follow-up colonoscopy is recommended.  3. Scattered tiny hypodensities in the liver which are too small to  characterize. No follow-up imaging is recommended if the patient is  low risk. In a high-risk patient, 3 six-month follow-up MRI without  and with contrast would be warranted.  4. Cholelithiasis without evidence of cholecystitis.  5. Gastritis.  6. Aortic and iliac atherosclerosis. Prior left common iliac artery  stenting.  7. Emphysema.  8. 7 mm focal sclerotic lesion in the posterior right ilium. This is  probably a bone island but a sclerotic metastasis is not excluded in  this age group.  9. Small right scrotal hydrocele.    I discussed all these findings with the patient.  He has yet to have a colonoscopy and I stressed that he needs to follow-up with GI and make an appointment. Will give referral today. Will treat w/ cipro/flagyl for colitis. He was given Bentyl/Tylenol for pain with improvement. Recommended  f/u with PCP regarding iliac and liver lesions.  He voices understanding. Tolerating PO well. Stable for discharge  Case discussed with Dr. Leonette Monarch who is agreeable to the above plan and  disposition   Results for orders placed or performed during the hospital encounter of 11/24/22  Lipase, blood  Result Value Ref Range   Lipase 82 (H) 11 - 51 U/L  Comprehensive metabolic panel  Result Value Ref Range   Sodium 136 135 - 145 mmol/L   Potassium 3.4 (L) 3.5 - 5.1 mmol/L   Chloride 106 98 - 111 mmol/L   CO2 24 22 - 32 mmol/L   Glucose, Bld 133 (H) 70 - 99 mg/dL   BUN 15 6 - 20 mg/dL   Creatinine, Ser 0.94 0.61 - 1.24 mg/dL   Calcium 8.7 (L) 8.9 - 10.3 mg/dL   Total Protein 6.9 6.5 - 8.1 g/dL   Albumin 3.6 3.5 - 5.0 g/dL   AST 19 15 - 41 U/L   ALT 19 0 - 44 U/L   Alkaline Phosphatase 65 38 - 126 U/L   Total Bilirubin 0.7 0.3 - 1.2 mg/dL   GFR, Estimated >60 >60 mL/min   Anion gap 6 5 - 15  CBC  Result Value Ref Range   WBC 8.4 4.0 - 10.5 K/uL   RBC 3.91 (L) 4.22 - 5.81 MIL/uL   Hemoglobin 12.4 (L) 13.0 - 17.0 g/dL   HCT 36.4 (L) 39.0 - 52.0 %   MCV 93.1 80.0 - 100.0 fL   MCH 31.7 26.0 - 34.0 pg   MCHC 34.1 30.0 - 36.0 g/dL   RDW 14.0 11.5 - 15.5 %   Platelets 155 150 - 400 K/uL   nRBC 0.0 0.0 - 0.2 %  Urinalysis, Routine w reflex microscopic -Urine,  Clean Catch  Result Value Ref Range   Color, Urine YELLOW YELLOW   APPearance CLEAR CLEAR   Specific Gravity, Urine 1.023 1.005 - 1.030   pH 5.0 5.0 - 8.0   Glucose, UA NEGATIVE NEGATIVE mg/dL   Hgb urine dipstick MODERATE (A) NEGATIVE   Bilirubin Urine NEGATIVE NEGATIVE   Ketones, ur NEGATIVE NEGATIVE mg/dL   Protein, ur NEGATIVE NEGATIVE mg/dL   Nitrite NEGATIVE NEGATIVE   Leukocytes,Ua NEGATIVE NEGATIVE   RBC / HPF 6-10 0 - 5 RBC/hpf   WBC, UA 0-5 0 - 5 WBC/hpf   Bacteria, UA RARE (A) NONE SEEN   Squamous Epithelial / HPF 0-5 0 - 5 /HPF   Mucus PRESENT    CT ABDOMEN PELVIS W CONTRAST  Result Date: 11/25/2022 CLINICAL DATA:  Periumbilical abdominal pain, nausea, and fatigue. EXAM: CT ABDOMEN AND PELVIS WITH CONTRAST TECHNIQUE: Multidetector CT imaging of the abdomen and pelvis was performed using the  standard protocol following bolus administration of intravenous contrast. RADIATION DOSE REDUCTION: This exam was performed according to the departmental dose-optimization program which includes automated exposure control, adjustment of the mA and/or kV according to patient size and/or use of iterative reconstruction technique. CONTRAST:  58mL OMNIPAQUE IOHEXOL 350 MG/ML SOLN COMPARISON:  Limited chest CT for coronary CTA dated 07/08/2020. No prior abdomen and pelvis CT or ultrasound. FINDINGS: Lower chest: Lung bases show emphysematous and linear scarring changes without infiltrates. The cardiac size is normal. Hepatobiliary: The liver demonstrates no mass enhancement. There are few scattered tiny hypodensities in the hepatic substance which are too small to characterize. There is a single subcentimeter stone in the gallbladder but no wall thickening or biliary dilatation. Pancreas: No abnormality Spleen: No abnormality. Adrenals/Urinary Tract: There is no adrenal mass. Both kidneys enhance homogeneously. There is no hydronephrosis or urinary stone. There is symmetric delayed phase renal excretion. The bladder wall is unremarkable. Stomach/Bowel: There are moderately thickened folds in the stomach. The unopacified small bowel is unremarkable. No dilatation, wall thickening or inflammatory change is seen. There is a moderate wall thickening or changes of underdistention of the proximal ascending colon. The appendix is normal caliber. There is mild fecal stasis in the transverse colon. There are a few uncomplicated sigmoid diverticula. There is irregularity along the ventral wall of the distal rectum measuring 2.8 x 1.3 cm on axial 3:68 and visible on the sagittal reformatted images on series 7 images 80-88. Indeterminate whether this represents unaerated stool or a mass. Follow-up colonoscopy is recommended. Vascular/Lymphatic: The mid to distal infrarenal aorta and iliac arteries are heavily calcified. There has  been a prior stenting of the left common iliac artery. There is no AAA. There are pelvic phleboliths. There is no appreciable adenopathy. Reproductive: Prostate is unremarkable. Both testicles are in the scrotal sac. There is a small right hydrocele. Other: There is no free air, free hemorrhage or free fluid. There is no incarcerated hernia. Musculoskeletal: Disc space loss L5-S1 with trace discogenic retrolisthesis, spondylosis and facet spurring. Mild hip DJD. Unilateral bridging osteophytes left SI joint. There is a 7 mm focal sclerotic lesion in the posterior right ilium. This is probably a bone island but a sclerotic metastasis is not excluded in this age group. No other focal bone lesion is seen. No acute or other significant osseous findings. IMPRESSION: 1. Moderate wall thickening versus underdistention of the proximal ascending colon. No inflammatory changes are seen. Correlate clinically for infiltrating disease or colitis. 2. 2.8 x 1.3 cm irregularity along the ventral wall  of the distal rectum. Indeterminate whether this represents unaerated stool or a mass. Follow-up colonoscopy is recommended. 3. Scattered tiny hypodensities in the liver which are too small to characterize. No follow-up imaging is recommended if the patient is low risk. In a high-risk patient, 3 six-month follow-up MRI without and with contrast would be warranted. 4. Cholelithiasis without evidence of cholecystitis. 5. Gastritis. 6. Aortic and iliac atherosclerosis. Prior left common iliac artery stenting. 7. Emphysema. 8. 7 mm focal sclerotic lesion in the posterior right ilium. This is probably a bone island but a sclerotic metastasis is not excluded in this age group. 9. Small right scrotal hydrocele. Aortic Atherosclerosis (ICD10-I70.0) and Emphysema (ICD10-J43.9). Electronically Signed   By: Almira Bar M.D.   On: 11/25/2022 03:32       Leone Brand 11/25/22 0406    Nira Conn, MD 11/28/22 [phone]

## 2022-11-27 ENCOUNTER — Telehealth: Payer: Self-pay

## 2022-11-27 ENCOUNTER — Encounter: Payer: Self-pay | Admitting: Gastroenterology

## 2022-11-27 NOTE — Telephone Encounter (Signed)
Return call unanswered.  

## 2022-11-27 NOTE — Telephone Encounter (Signed)
Copied from CRM [id number]. Topic: General - Other >> Nov 27, 2022 11:15 AM Patsy Lager T wrote: Reason for CRM: Patient called requesting a call as he thinks the appt on 6/10 is tool far out and he wants her to try to get him a sooner appt

## 2022-11-27 NOTE — Telephone Encounter (Signed)
Pt is returning a call from the practice.

## 2022-12-10 ENCOUNTER — Other Ambulatory Visit: Payer: Self-pay

## 2022-12-10 ENCOUNTER — Other Ambulatory Visit: Payer: Self-pay | Admitting: Physician Assistant

## 2022-12-11 ENCOUNTER — Other Ambulatory Visit: Payer: Self-pay

## 2022-12-14 ENCOUNTER — Other Ambulatory Visit: Payer: Self-pay

## 2022-12-14 ENCOUNTER — Other Ambulatory Visit: Payer: Self-pay | Admitting: Physician Assistant

## 2022-12-16 ENCOUNTER — Ambulatory Visit: Payer: Self-pay | Admitting: Nurse Practitioner

## 2023-01-01 ENCOUNTER — Other Ambulatory Visit: Payer: Self-pay

## 2023-01-06 ENCOUNTER — Other Ambulatory Visit: Payer: Self-pay

## 2023-01-06 MED ORDER — METRONIDAZOLE 500 MG PO TABS
500.0000 mg | ORAL_TABLET | Freq: Two times a day (BID) | ORAL | 0 refills | Status: AC
  Filled 2023-01-06: qty 14, 7d supply, fill #0

## 2023-01-06 MED ORDER — CIPROFLOXACIN HCL 500 MG PO TABS
500.0000 mg | ORAL_TABLET | Freq: Two times a day (BID) | ORAL | 0 refills | Status: AC
  Filled 2023-01-06: qty 14, 7d supply, fill #0

## 2023-01-07 ENCOUNTER — Other Ambulatory Visit: Payer: Self-pay

## 2023-02-01 ENCOUNTER — Other Ambulatory Visit: Payer: Self-pay

## 2023-02-01 ENCOUNTER — Ambulatory Visit (INDEPENDENT_AMBULATORY_CARE_PROVIDER_SITE_OTHER): Payer: Self-pay | Admitting: Gastroenterology

## 2023-02-01 ENCOUNTER — Encounter: Payer: Self-pay | Admitting: Gastroenterology

## 2023-02-01 ENCOUNTER — Telehealth: Payer: Self-pay

## 2023-02-01 VITALS — BP 110/70 | HR 82 | Ht 70.0 in | Wt 139.0 lb

## 2023-02-01 DIAGNOSIS — I739 Peripheral vascular disease, unspecified: Secondary | ICD-10-CM

## 2023-02-01 DIAGNOSIS — Z800 Family history of malignant neoplasm of digestive organs: Secondary | ICD-10-CM

## 2023-02-01 DIAGNOSIS — Z7902 Long term (current) use of antithrombotics/antiplatelets: Secondary | ICD-10-CM

## 2023-02-01 DIAGNOSIS — R933 Abnormal findings on diagnostic imaging of other parts of digestive tract: Secondary | ICD-10-CM

## 2023-02-01 MED ORDER — PEG 3350-KCL-NA BICARB-NACL 420 G PO SOLR
ORAL | 0 refills | Status: DC
  Filled 2023-02-01: qty 4000, 1d supply, fill #0

## 2023-02-01 MED ORDER — NA SULFATE-K SULFATE-MG SULF 17.5-3.13-1.6 GM/177ML PO SOLN
1.0000 | Freq: Once | ORAL | 0 refills | Status: DC
  Filled 2023-02-01: qty 354, 1d supply, fill #0

## 2023-02-01 NOTE — Telephone Encounter (Signed)
[facility] Pre-operative Risk Assessment     Request for surgical clearance:     Endoscopy Procedure  What type of surgery is being performed?     EGD and Colonoscopy  When is this surgery scheduled?     02/18/23  What type of clearance is required ?   Pharmacy  Are there any medications that need to be held prior to surgery and how long? Plavix 5 days   Practice name and name of physician performing surgery?      [facility]  What is your office phone and fax number?      Phone- [phone]  Fax- [phone]  Anesthesia type (None, local, MAC, general) ?       MAC

## 2023-02-01 NOTE — Patient Instructions (Signed)
_______________________________________________________  If your blood pressure at your visit was 140/90 or greater, please contact your primary care physician to follow up on this.  _______________________________________________________  If you are age 61 or older, your body mass index should be between 23-30. Your Body mass index is 19.94 kg/m. If this is out of the aforementioned range listed, please consider follow up with your Primary Care Provider.  If you are age 45 or younger, your body mass index should be between 19-25. Your Body mass index is 19.94 kg/m. If this is out of the aformentioned range listed, please consider follow up with your Primary Care Provider.   You have been scheduled for an endoscopy and colonoscopy. Please follow the written instructions given to you at your visit today. Please pick up your prep supplies at the pharmacy within the next 1-3 days. If you use inhalers (even only as needed), please bring them with you on the day of your procedure.   ________________________________________________________  The [facility] providers would like to encourage you to use [facility] to communicate with providers for non-urgent requests or questions.  Due to long hold times on the telephone, sending your provider a message by [facility] may be a faster and more efficient way to get a response.  Please allow 48 business hours for a response.  Please remember that this is for non-urgent requests.   It was a pleasure to see you today!  Thank you for trusting me with your gastrointestinal care!

## 2023-02-01 NOTE — Telephone Encounter (Signed)
Pt was made aware that this is appt is to clear him for his procedure that it is important to attend appt. He is scheduled for 6/18 at 8:50am with Joni Reining, NP. I will forward to provider.

## 2023-02-01 NOTE — Progress Notes (Signed)
HPI : Nicholas Espinoza is a very pleasant 62 year old male with a history of peripheral artery disease on aspirin and Plavix who was referred to [place] by Bertram Denver, NP for further evaluation of abnormal CT scan.  The patient went to the emergency department April 4 with 2 days of abdominal pain which began abruptly.  CT scan showed thickening of the ascending colon, as well as an irregularity of the rectal wall.  He was prescribed Cipro and Flagyl, and reports feeling better within a couple of days.   He does not recall having diarrhea with this pain. He has continued to have occasional episodes of abdominal pain since then.  The pain is located above the umbilicus.  He reports having this may be a couple times a month.  He has regular bowel movements, typically 1/day.  Stool is usually formed and brown.  He has seen blood in his stool intermittently over many years.  This is bright red blood, noticed mostly on the toilet paper.  Occasionally he will have straining with defecation.  He denies symptoms of nausea or vomiting.  No GERD symptoms.  No dysphagia. His weight has been stable.  His appetite is good.  No problems with early satiety.  His brother was diagnosed with colon cancer in his 50s or 49s.  The patient has never had a colonoscopy or upper endoscopy.  He has a history of peripheral artery disease and underwent stenting of the left common iliac artery in 2022.  He has been on aspirin and Plavix since then.  He has not had any recurrence of any his claudication symptoms.  He does not have any known history of any heart disease.  An echocardiogram in 2021 showed normal ejection fraction.  A coronary CT in 2021 showed nonobstructive coronary artery disease and nonsevere calcification The patient denies any symptoms of chest pain/chest pressure.  No lightheadedness/dizziness, palpitations.  Stable exertional dyspnea.  No orthopnea or lower extremity swelling.  He is a chronic smoker, but has  reduced his smoking significantly.     CT abdomen/pelvis November 25, 2022 IMPRESSION: 1. Moderate wall thickening versus underdistention of the proximal ascending colon. No inflammatory changes are seen. Correlate clinically for infiltrating disease or colitis. 2. 2.8 x 1.3 cm irregularity along the ventral wall of the distal rectum. Indeterminate whether this represents unaerated stool or a mass. Follow-up colonoscopy is recommended. 3. Scattered tiny hypodensities in the liver which are too small to characterize. No follow-up imaging is recommended if the patient is low risk. In a high-risk patient, 3 six-month follow-up MRI without and with contrast would be warranted. 4. Cholelithiasis without evidence of cholecystitis. 5. Gastritis. 6. Aortic and iliac atherosclerosis. Prior left common iliac artery stenting. 7. Emphysema. 8. 7 mm focal sclerotic lesion in the posterior right ilium. This is probably a bone island but a sclerotic metastasis is not excluded in this age group. 9. Small right scrotal hydrocele.   Aortic Atherosclerosis (ICD10-I70.0) and Emphysema (ICD10-J43.9).   Past Medical History:  Diagnosis Date   Left ventricular diastolic dysfunction, NYHA class 1      Past Surgical History:  Procedure Laterality Date   ABDOMINAL AORTOGRAM W/LOWER EXTREMITY Bilateral 09/02/2020   Procedure: ABDOMINAL AORTOGRAM W/LOWER EXTREMITY;  Surgeon: Runell Gess, MD;  [facility]: [facility];  Service: Cardiovascular;  Laterality: Bilateral;   DENTAL SURGERY     PERIPHERAL VASCULAR ATHERECTOMY Left 09/02/2020   Procedure: PERIPHERAL VASCULAR ATHERECTOMY;  Surgeon: Runell Gess, MD;  Location:  [facility];  Service: Cardiovascular;  Laterality: Left;   PERIPHERAL VASCULAR INTERVENTION Left 09/02/2020   Procedure: PERIPHERAL VASCULAR INTERVENTION;  Surgeon: Runell Gess, MD;  Location: [facility];  Service: Cardiovascular;  Laterality: Left;    Family History  Problem Relation Age of Onset   Heart attack Mother    Heart attack Brother 75   Cancer - Other Father    Diabetes Sister    Diabetes Sister    Social History   Tobacco Use   Smoking status: Some Days    Packs/day: 0.25    Years: 0.50    Additional pack years: 0.00    Total pack years: 0.13    Types: Cigarettes   Smokeless tobacco: Never   Tobacco comments:    less then 1 cigarette daily  Vaping Use   Vaping Use: Never used  Substance Use Topics   Alcohol use: Yes   Drug use: Yes    Types: Marijuana   Current Outpatient Medications  Medication Sig Dispense Refill   aspirin EC 81 MG tablet Take 81 mg by mouth daily. Swallow whole.     clopidogrel (PLAVIX) 75 MG tablet Take 1 tablet (75 mg total) by mouth daily with breakfast. 90 tablet 4   gabapentin (NEURONTIN) 300 MG capsule TAKE 1 CAPSULE (300 MG TOTAL) BY MOUTH 3 (THREE) TIMES DAILY. 90 capsule 3   rosuvastatin (CRESTOR) 20 MG tablet Take 1 tablet (20 mg total) by mouth daily. 90 tablet 3   Current Facility-Administered Medications  Medication Dose Route Frequency Provider Last Rate Last Admin   sodium chloride flush (NS) 0.9 % injection 3 mL  3 mL Intravenous Q12H Runell Gess, MD       No Known Allergies   Review of Systems: All systems reviewed and negative except where noted in HPI.    No results found.  Physical Exam: BP 110/70   Pulse 82   Ht 5\' 10"  (1.778 m)   Wt 139 lb (63 kg)   BMI 19.94 kg/m  Constitutional: Pleasant,well-developed, African-American male in no acute distress. HEENT: Normocephalic and atraumatic. Conjunctivae are normal. No scleral icterus.  Edentulous Neck supple.  Cardiovascular: Normal rate, regular rhythm.  Pulmonary/chest: Effort normal and breath sounds normal. No wheezing, rales or rhonchi. Abdominal: Soft, nondistended, nontender. Bowel sounds active throughout. There are no masses palpable. No hepatomegaly. Extremities: no edema Neurological:  Alert and oriented to person place and time. Skin: Skin is warm and dry. No rashes noted. Psychiatric: Normal mood and affect. Behavior is normal.  CBC    Component Value Date/Time   WBC 8.4 11/24/2022 1818   RBC 3.91 (L) 11/24/2022 1818   HGB 12.4 (L) 11/24/2022 1818   HGB 13.4 12/09/2021 1600   HCT 36.4 (L) 11/24/2022 1818   HCT 40.3 12/09/2021 1600   PLT 155 11/24/2022 1818   PLT 149 (L) 12/09/2021 1600   MCV 93.1 11/24/2022 1818   MCV 96 12/09/2021 1600   MCH 31.7 11/24/2022 1818   MCHC 34.1 11/24/2022 1818   RDW 14.0 11/24/2022 1818   RDW 12.8 12/09/2021 1600   LYMPHSABS 2.2 12/09/2021 1600   MONOABS 0.6 09/20/2009 1508   EOSABS 0.4 12/09/2021 1600   BASOSABS 0.1 12/09/2021 1600    CMP     Component Value Date/Time   NA 136 11/24/2022 1818   NA 142 12/09/2021 1600   K 3.4 (L) 11/24/2022 1818   CL 106 11/24/2022 1818   CO2 24 11/24/2022 1818  GLUCOSE 133 (H) 11/24/2022 1818   BUN 15 11/24/2022 1818   BUN 13 12/09/2021 1600   CREATININE 0.94 11/24/2022 1818   CALCIUM 8.7 (L) 11/24/2022 1818   PROT 6.9 11/24/2022 1818   PROT 7.4 12/09/2021 1600   ALBUMIN 3.6 11/24/2022 1818   ALBUMIN 4.7 12/09/2021 1600   AST 19 11/24/2022 1818   ALT 19 11/24/2022 1818   ALKPHOS 65 11/24/2022 1818   BILITOT 0.7 11/24/2022 1818   BILITOT 0.4 12/09/2021 1600   GFRNONAA >60 11/24/2022 1818   GFRAA 98 08/12/2020 1217       Latest Ref Rng & Units 11/24/2022    6:18 PM 12/09/2021    4:00 PM 08/12/2020   12:17 PM  CBC EXTENDED  WBC 4.0 - 10.5 K/uL 8.4  8.2  7.8   RBC 4.22 - 5.81 MIL/uL 3.91  4.18  4.55   Hemoglobin 13.0 - 17.0 g/dL 29.5  62.1  30.8   HCT 39.0 - 52.0 % 36.4  40.3  44.1   Platelets 150 - 400 K/uL 155  149  167   NEUT# 1.4 - 7.0 x10E3/uL  4.9    Lymph# 0.7 - 3.1 x10E3/uL  2.2        ASSESSMENT AND PLAN: 62 year old male with family history of colon cancer, with recent CT scan showing thickening of the ascending colon and rectal wall irregularity.  Diffuse  gastric wall thickening also noted.  He presented with abdominal pain, which he said resolved with antibiotics, but he has continued to have bouts of abdominal pain localized to the upper abdomen.  The patient's clinical history is not very consistent with colitis, given the absence of any diarrhea or blood in the stool.  He needs a colonoscopy for colon cancer screening, but the colon wall thickening is also very concerning.  Because of his gastric wall thickening and recurrent abdominal pain,  I think we should also perform an upper endoscopy. Will schedule patient for EGD and colonoscopy.  Plan to hold Plavix for 5 days.  Will send a letter to his cardiologist to ensure that this is reasonable.  Abnormal CT of the GI tract - EGD, colonoscopy - Hold Plavix x 5 days; letter to cardiology  Family history of colon cancer - Colonoscopy  Abdominal pain - EGD  The details, risks (including bleeding, perforation, infection, missed lesions, medication reactions and possible hospitalization or surgery if complications occur), benefits, and alternatives to colonoscopy with possible biopsy and possible polypectomy were discussed with the patient and he consents to proceed.   Jamella Grayer E. Tomasa Rand, MD [facility]   Claiborne Rigg, NP

## 2023-02-01 NOTE — Telephone Encounter (Signed)
   Name: Nicholas Espinoza  DOB: 10-30-1960  MRN: [id number]  Primary Cardiologist: None  Chart reviewed as part of pre-operative protocol coverage. Because of Dejour Vos Estabrook's past medical history and time since last visit, he will require a follow-up in-office visit in order to better assess preoperative cardiovascular risk.  Pre-op covering staff: - Please schedule appointment and call patient to inform them. If patient already had an upcoming appointment within acceptable timeframe, please add "pre-op clearance" to the appointment notes so provider is aware. - Please contact requesting surgeon's office via preferred method (i.e, phone, fax) to inform them of need for appointment prior to surgery.  Plavix hold can be determined at the time of in person appointment.  Sharlene Dory, PA-C  02/01/2023, 4:30 PM

## 2023-02-02 NOTE — Progress Notes (Signed)
Cardiology Clinic Note   Patient Name: Nicholas Espinoza Date of Encounter: 02/09/2023  Primary Care Provider:  Claiborne Rigg, NP Primary Cardiologist:  Reatha Harps, MD Primary Vascular Cardiologist: Nanetta Batty MD   Patient Profile    62 year old male with known history of PAD, with abnormal ABI on the left is 0.87, status post peripheral angiography by Dr. Allyson Sabal on 09/02/2020 revealing highly calcified 90% left common iliac artery stenosis.  He is status post orbital atherectomy, PTA, and covered stenting using 8 mm x 59 mm long VBX covered stent.  Mr. Arciero also has a history of Brugada pattern, ZIO negative for arrhythmia, ongoing tobacco abuse, chronic shortness of breath, noncardiac chest pain.   Past Medical History    Past Medical History:  Diagnosis Date   Left ventricular diastolic dysfunction, NYHA class 1    Past Surgical History:  Procedure Laterality Date   ABDOMINAL AORTOGRAM W/LOWER EXTREMITY Bilateral 09/02/2020   Procedure: ABDOMINAL AORTOGRAM W/LOWER EXTREMITY;  Surgeon: Runell Gess, MD;  Location: [facility];  Service: Cardiovascular;  Laterality: Bilateral;   DENTAL SURGERY     PERIPHERAL VASCULAR ATHERECTOMY Left 09/02/2020   Procedure: PERIPHERAL VASCULAR ATHERECTOMY;  Surgeon: Runell Gess, MD;  Location: [facility] INVAS[facility] CV [facility];  Service: Cardiovascular;  Laterality: Left;   PERIPHERAL VASCULAR INTERVENTION Left 09/02/2020   Procedure: PERIPHERAL VASCULAR INTERVENTION;  Surgeon: Runell Gess, MD;  Location: [facility];  Service: Cardiovascular;  Laterality: Left;    Allergies  No Known Allergies  History of Present Illness    Mr. 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  Mr.  Sampath has lost approximately 10 pounds, and is planned for EGD and colonoscopy on 02/18/2023.  He has been complaining of some abdominal pain and low-grade nausea.  He is also interested in smoking cessation.  He denies any tarry stools, hemoptysis, or melena.  Home Medications    Current Outpatient Medications  Medication Sig Dispense Refill   aspirin EC 81 MG tablet Take 81 mg by mouth daily. Swallow whole.     clopidogrel (PLAVIX) 75 MG tablet Take 1 tablet (75 mg total) by mouth daily with breakfast. 90 tablet 4   gabapentin (NEURONTIN) 300 MG capsule TAKE 1 CAPSULE (300 MG TOTAL) BY MOUTH 3 (THREE) TIMES DAILY. 90 capsule 3   polyethylene glycol-electrolytes (NULYTELY) 420 g solution USE AS DIRECTED 4000 mL 0   rosuvastatin (CRESTOR) 20 MG tablet Take 1 tablet (20 mg total) by mouth daily. 90 tablet 3   Current Facility-Administered Medications  Medication Dose Route Frequency Provider Last Rate Last Admin   sodium chloride flush (NS) 0.9 % injection 3 mL  3 mL Intravenous Q12H Runell Gess, MD         Family History    Family History  Problem Relation Age of Onset   Heart attack Mother    Heart attack Brother 72   Cancer - Other Father    Diabetes Sister    Diabetes Sister    He indicated that his mother is deceased. He indicated that his father is deceased. He indicated that his brother is deceased.  Social History    Social History   Socioeconomic History   Marital status: Single    Spouse name: Not on file  Number of children: 4   Years of education: Not on file   Highest education level: Not on file  Occupational History   Occupation: unemployed  Tobacco Use   Smoking status: Some Days    Packs/day: 0.25    Years: 0.50    Additional pack years: 0.00    Total pack years: 0.13    Types: Cigarettes   Smokeless tobacco: Never   Tobacco comments:    less then 1 cigarette daily  Vaping Use   Vaping Use: Never used  Substance and Sexual Activity    Alcohol use: Yes   Drug use: Yes    Types: Marijuana   Sexual activity: Yes  Other Topics Concern   Not on file  Social History Narrative   Not on file   Social Determinants of Health   Financial Resource Strain: Not on file  Food Insecurity: Not on file  Transportation Needs: Not on file  Physical Activity: Not on file  Stress: Not on file  Social Connections: Not on file  Intimate Partner Violence: Not on file     Review of Systems    General:  No chills, fever, night sweats or weight changes.  Cardiovascular:  No chest pain, dyspnea on exertion, edema, orthopnea, palpitations, paroxysmal nocturnal dyspnea. Dermatological: No rash, lesions/masses Respiratory: No cough, dyspnea Urologic: No hematuria, dysuria Abdominal:   No nausea, vomiting, diarrhea, bright red blood per rectum, melena, or hematemesis Neurologic:  No visual changes, wkns, changes in mental status. All other systems reviewed and are otherwise negative except as noted above.     Physical Exam    VS:  BP 93/60 (BP Location: Left Arm, Patient Position: Sitting, Cuff Size: Normal)   Pulse 65   Ht 5\' 8"  (1.727 m)   Wt 129 lb 3.2 oz (58.6 kg)   SpO2 98%   BMI 19.64 kg/m  , BMI Body mass index is 19.64 kg/m.     GEN: Well nourished, well developed, in no acute distress. HEENT: normal. Neck: Supple, no JVD, carotid bruits, or masses. Cardiac: RRR, no murmurs, rubs, or gallops. No clubbing, cyanosis, edema.  Radials/DP/PT 2+ and equal bilaterally.  Respiratory:  Respirations regular and unlabored, clear to auscultation bilaterally. GI: Soft, nontender, nondistended, BS + x 4. MS: no deformity or atrophy. Skin: warm and dry, no rash. Neuro:  Strength and sensation are intact. Psych: Normal affect.  Accessory Clinical Findings    ECG personally reviewed by me today- NSR with LAD, Brugada pattern. HR 65 bpm   Lab Results  Component Value Date   WBC 8.4 11/24/2022   HGB 12.4 (L) 11/24/2022   HCT  36.4 (L) 11/24/2022   MCV 93.1 11/24/2022   PLT 155 11/24/2022   Lab Results  Component Value Date   CREATININE 0.94 11/24/2022   BUN 15 11/24/2022   NA 136 11/24/2022   K 3.4 (L) 11/24/2022   CL 106 11/24/2022   CO2 24 11/24/2022   Lab Results  Component Value Date   ALT 19 11/24/2022   AST 19 11/24/2022   ALKPHOS 65 11/24/2022   BILITOT 0.7 11/24/2022   Lab Results  Component Value Date   CHOL 169 05/01/2020   HDL 54 05/01/2020   LDLCALC 100 (H) 05/01/2020   TRIG 79 05/01/2020   CHOLHDL 3.1 05/01/2020    Lab Results  Component Value Date   HGBA1C 5.4 01/24/2020    Review of Prior Studies: Cardiac CTA 07/08/2020 Calcium score: Calcium noted in proximal LAD  Coronary Arteries: Right dominant with no anomalies   LM: Normal   LAD: 1-24% calcified ostial LAD 1-24% mixed plaque in proximal vessel with external remodeling   IM: Medium sized vessel normal   D1: Normal   D2: Normal   Circumflex: Normal   OM1: Normal   AV Groove: Normal   RCA: Normal   PDA: Normal   PLA: Normal   IMPRESSION: 1.  Calcium score 53 which is 63 rd percentile for age / sex   2.   Normal aortic root 3.5 cm   3.   CAD RADS 1 non-obstructive CAD in LAD  Abdominal Aortogram 09/02/2020 Procedures Performed:               1. ultrasound-guided left common femoral access               2. abdominal aortogram/bilateral iliac angiogram               3. orbital atherectomy/VBX stenting left common iliac artery               4. Perclose left common femoral artery  Assessment & Plan   1. Pre-Operative Cardiac Evaluation:   Chart reviewed as part of pre-operative protocol coverage. Given past medical history and time since last visit, based on ACC/AHA guidelines, DECLIN KRAS would be at acceptable risk for the planned procedure without further cardiovascular testing.   He is to hold aspirin and Plavix 5 days prior to the procedure (on 02/13/2023 for procedure on 02/18/2023) and  restart ASAP as soon as hemodynamically stable.  2.  PAD: Followed by Dr. Allyson Sabal status post peripheral angiography status post orbital atherectomy, PTA and covered stenting of a highly calcified 90% left common iliac artery stenosis.  The patient is asymptomatic for claudication symptoms.  Continue to follow with Dr. Allyson Sabal.  Continue DAPT with this patient as discussed above periprocedure.  3.  Hypotension: Noted that this has been occurring with his weight loss.  It was rechecked in the clinic room and correlated with initial triage blood pressure.  He was advised to increase his fluids.  I am checking a CBC and a BMET to evaluate for anemia and dehydration.  He is not on any antihypertensive medications.  4.  Tobacco abuse: Patient wishes to have information concerning smoking cessation.  He smokes 2 to 3 cigarettes a day and would like to quit.  He is referred to [facility] tobacco cessation program.   5.  Hypercholesterolemia: Remains on statin therapy with rosuvastatin 20 mg daily.  He will need to have follow-up fasting lipids and LFTs on next appointment unless completed by primary care provider.        Signed, Bettey Mare. Liborio Nixon, ANP, AACC   02/09/2023 9:11 AM      Office [phone] Fax [phone]  Notice: This dictation was prepared with Dragon dictation along with smaller phrase technology. Any transcriptional errors that result from this process are unintentional and may not be corrected upon review.

## 2023-02-09 ENCOUNTER — Encounter: Payer: Self-pay | Admitting: Adult Health

## 2023-02-09 ENCOUNTER — Ambulatory Visit: Payer: Self-pay | Attending: Adult Health | Admitting: Adult Health

## 2023-02-09 VITALS — BP 93/60 | HR 65 | Ht 68.0 in | Wt 129.2 lb

## 2023-02-09 DIAGNOSIS — I959 Hypotension, unspecified: Secondary | ICD-10-CM

## 2023-02-09 DIAGNOSIS — I739 Peripheral vascular disease, unspecified: Secondary | ICD-10-CM

## 2023-02-09 DIAGNOSIS — Z01818 Encounter for other preprocedural examination: Secondary | ICD-10-CM

## 2023-02-09 DIAGNOSIS — Z720 Tobacco use: Secondary | ICD-10-CM

## 2023-02-09 DIAGNOSIS — I498 Other specified cardiac arrhythmias: Secondary | ICD-10-CM

## 2023-02-09 DIAGNOSIS — E7800 Pure hypercholesterolemia, unspecified: Secondary | ICD-10-CM

## 2023-02-09 NOTE — Addendum Note (Signed)
Addended by: Myna Hidalgo A on: 02/09/2023 02:14 PM   Modules accepted: Orders

## 2023-02-09 NOTE — Patient Instructions (Signed)
Medication Instructions:  No Changes *If you need a refill on your cardiac medications before your next appointment, please call your pharmacy*   Lab Work: CBC, BMET Today If you have labs (blood work) drawn today and your tests are completely normal, you will receive your results only by: MyChart Message (if you have MyChart) OR A paper copy in the mail If you have any lab test that is abnormal or we need to change your treatment, we will call you to review the results.   Testing/Procedures: No Testing   Follow-Up: At [facility], you and your health needs are our priority.  As part of our continuing mission to provide you with exceptional heart care, we have created designated Provider Care Teams.  These Care Teams include your primary Cardiologist (physician) and Advanced Practice Providers (APPs -  Physician Assistants and Nurse Practitioners) who all work together to provide you with the care you need, when you need it.  We recommend signing up for the patient portal called "MyChart".  Sign up information is provided on this After Visit Summary.  MyChart is used to connect with patients for Virtual Visits (Telemedicine).  Patients are able to view lab/test results, encounter notes, upcoming appointments, etc.  Non-urgent messages can be sent to your provider as well.   To learn more about what you can do with MyChart, go to [email or web address].    Your next appointment:   6 month(s)  Provider:   Reatha Harps, MD    Other Instructions Hold Plavix and Aspirin For 5 Days Starting 02/13/23 before procedure.

## 2023-02-10 ENCOUNTER — Telehealth: Payer: Self-pay | Admitting: Licensed Clinical Social Worker

## 2023-02-10 ENCOUNTER — Telehealth: Payer: Self-pay

## 2023-02-10 LAB — CBC
Hematocrit: 39.3 % (ref 37.5–51.0)
Hemoglobin: 12.9 g/dL — ABNORMAL LOW (ref 13.0–17.7)
MCH: 31.5 pg (ref 26.6–33.0)
MCHC: 32.8 g/dL (ref 31.5–35.7)
MCV: 96 fL (ref 79–97)
Platelets: 154 10*3/uL (ref 150–450)
RBC: 4.1 x10E6/uL — ABNORMAL LOW (ref 4.14–5.80)
RDW: 12.8 % (ref 11.6–15.4)
WBC: 6 10*3/uL (ref 3.4–10.8)

## 2023-02-10 LAB — BASIC METABOLIC PANEL
BUN/Creatinine Ratio: 24 (ref 10–24)
BUN: 21 mg/dL (ref 8–27)
CO2: 26 mmol/L (ref 20–29)
Calcium: 9.5 mg/dL (ref 8.6–10.2)
Chloride: 106 mmol/L (ref 96–106)
Creatinine, Ser: 0.87 mg/dL (ref 0.76–1.27)
Glucose: 68 mg/dL — ABNORMAL LOW (ref 70–99)
Potassium: 4.8 mmol/L (ref 3.5–5.2)
Sodium: 142 mmol/L (ref 134–144)
eGFR: 99 mL/min/{1.73_m2} (ref >59)

## 2023-02-10 NOTE — Telephone Encounter (Signed)
H&V Care Navigation CSW Progress Note  Clinical Social Worker contacted patient by phone to f/u after appt with Nicholas Espinoza as pt is uninsured. Also referred for additional care navigation team support. I was able to reach pt at [phone]. Introduced self, role, reason for call. Explained referrals, pt confirms his previous Rosann Auerbach has termed, he also recalls discussing smoking cessation with Nicholas Espinoza. LCSW will call pt back per his request tomorrow after 3:30pm as pt is off work at that time. We will discuss other referrals again then.  Patient is participating in a Managed Medicaid Plan:  No, self pay only  SDOH Screenings   Depression (PHQ2-9): Low Risk  (03/04/2022)  Tobacco Use: High Risk (02/09/2023)   Nicholas Espinoza, MSW, LCSW Clinical Social Worker II [facility] Heart/Vascular Care Navigation  [phone]- work cell phone (preferred) [phone]- desk phone

## 2023-02-10 NOTE — Telephone Encounter (Addendum)
Called patient regarding results. Patient had understanding of results.----- Message from Jodelle Gross, NP sent at 02/10/2023  8:42 AM EDT ----- No evidence of anemia or dehydration contributing to low blood pressure. He is not on any blood pressure reducing medications.  Continue to follow with PCP and GI.

## 2023-02-10 NOTE — Telephone Encounter (Addendum)
Called patient regarding results. Patient had understanding of results.----- Message from Kathryn M Lawrence, NP sent at 02/10/2023  8:42 AM EDT ----- No evidence of anemia or dehydration contributing to low blood pressure. He is not on any blood pressure reducing medications.  Continue to follow with PCP and GI.   

## 2023-02-12 ENCOUNTER — Telehealth: Payer: Self-pay | Admitting: Licensed Clinical Social Worker

## 2023-02-12 NOTE — Telephone Encounter (Signed)
H&V Care Navigation CSW Progress Note  Clinical Social Worker contacted patient by phone again as requested to f/u after appt with Samara Deist as pt is uninsured. Also referred for additional care navigation team support. I was able to reach pt at [phone]. Re- introduced self, role, reason for call. Explained referrals, he is on his way home from work. Requested I call back after 4 when he is home. I called back twice after 4pm, no answer, left voicemail requesting pt call me back as able.    Patient is participating in a Managed Medicaid Plan:  No, self pay only    SDOH Screenings   Depression (PHQ2-9): Low Risk  (03/04/2022)  Tobacco Use: High Risk (02/09/2023)    Octavio Graves, MSW, LCSW Clinical Social Worker II [facility] Heart/Vascular Care Navigation  [phone]- work cell phone (preferred) [phone]- desk phone

## 2023-02-17 ENCOUNTER — Telehealth: Payer: Self-pay | Admitting: Licensed Clinical Social Worker

## 2023-02-17 NOTE — Telephone Encounter (Signed)
H&V Care Navigation CSW Progress Note  Clinical Social Worker contacted patient by phone again as requested to f/u after appt with Samara Deist as pt is uninsured. Also referred for additional care navigation team support. I was unable to reach pt at [phone], left voicemail requesting pt call me back as able.    Patient is participating in a Managed Medicaid Plan:  No, self pay only    SDOH Screenings   Depression (PHQ2-9): Low Risk  (03/04/2022)  Tobacco Use: High Risk (02/09/2023)    Octavio Graves, MSW, LCSW Clinical Social Worker II [facility] Heart/Vascular Care Navigation  [phone]- work cell phone (preferred) [phone]- desk phone

## 2023-02-18 ENCOUNTER — Ambulatory Visit (AMBULATORY_SURGERY_CENTER): Payer: Self-pay | Admitting: Gastroenterology

## 2023-02-18 ENCOUNTER — Encounter: Payer: Self-pay | Admitting: Gastroenterology

## 2023-02-18 VITALS — BP 124/72 | HR 54 | Temp 97.5°F | Resp 13 | Ht 70.0 in | Wt 139.0 lb

## 2023-02-18 DIAGNOSIS — D123 Benign neoplasm of transverse colon: Secondary | ICD-10-CM

## 2023-02-18 DIAGNOSIS — K3189 Other diseases of stomach and duodenum: Secondary | ICD-10-CM

## 2023-02-18 DIAGNOSIS — D122 Benign neoplasm of ascending colon: Secondary | ICD-10-CM

## 2023-02-18 DIAGNOSIS — K317 Polyp of stomach and duodenum: Secondary | ICD-10-CM

## 2023-02-18 DIAGNOSIS — K2950 Unspecified chronic gastritis without bleeding: Secondary | ICD-10-CM

## 2023-02-18 DIAGNOSIS — R933 Abnormal findings on diagnostic imaging of other parts of digestive tract: Secondary | ICD-10-CM

## 2023-02-18 DIAGNOSIS — K2980 Duodenitis without bleeding: Secondary | ICD-10-CM

## 2023-02-18 DIAGNOSIS — Z800 Family history of malignant neoplasm of digestive organs: Secondary | ICD-10-CM

## 2023-02-18 DIAGNOSIS — D124 Benign neoplasm of descending colon: Secondary | ICD-10-CM

## 2023-02-18 MED ORDER — SODIUM CHLORIDE 0.9 % IV SOLN
500.0000 mL | Freq: Once | INTRAVENOUS | Status: DC
Start: 2023-02-18 — End: 2023-02-18

## 2023-02-18 NOTE — Op Note (Signed)
[facility] Patient Name: Nicholas Espinoza Procedure Date: 02/18/2023 1:39 PM MRN: [id number] Endoscopist: Lorin Picket E. Tomasa Rand , MD, [id number] Age: 62 Referring MD:  Date of Birth: 04/16/1961 Gender: Male Account #: [id number] Procedure:                Colonoscopy Indications:              Abnormal CT of the GI tract Medicines:                Monitored Anesthesia Care Procedure:                Pre-Anesthesia Assessment:                           - Prior to the procedure, a History and Physical                            was performed, and patient medications and                            allergies were reviewed. The patient's tolerance of                            previous anesthesia was also reviewed. The risks                            and benefits of the procedure and the sedation                            options and risks were discussed with the patient.                            All questions were answered, and informed consent                            was obtained. Prior Anticoagulants: The patient has                            taken Plavix (clopidogrel), last dose was 5 days                            prior to procedure. ASA Grade Assessment: II - A                            patient with mild systemic disease. After reviewing                            the risks and benefits, the patient was deemed in                            satisfactory condition to undergo the procedure.                           After obtaining informed consent, the colonoscope  was passed under direct vision. Throughout the                            procedure, the patient's blood pressure, pulse, and                            oxygen saturations were monitored continuously. The                            CF [id number] #[id number] was introduced through the anus                            and advanced to the the cecum, identified by                             appendiceal orifice and ileocecal valve. The                            colonoscopy was performed without difficulty. The                            patient tolerated the procedure well. The quality                            of the bowel preparation was good. The ileocecal                            valve, appendiceal orifice, and rectum were                            photographed. The bowel preparation used was SUPREP                            via split dose instruction. Scope In: 2:01:20 PM Scope Out: 2:28:16 PM Scope Withdrawal Time: 0 hours 19 minutes 1 second  Total Procedure Duration: 0 hours 26 minutes 56 seconds  Findings:                 The perianal and digital rectal examinations were                            normal. Pertinent negatives include normal                            sphincter tone and no palpable rectal lesions.                           A 4 mm polyp was found in the ascending colon. The                            polyp was sessile. The polyp was removed with a                            cold snare. Resection and  retrieval were complete.                            Estimated blood loss was minimal.                           Three sessile polyps were found in the splenic                            flexure. The polyps were 3 to 12 mm in size. These                            polyps were removed with a cold snare. Resection                            and retrieval were complete. Estimated blood loss                            was minimal.                           A 5 mm polyp was found in the descending colon. The                            polyp was sessile. The polyp was removed with a                            cold snare. Resection and retrieval were complete.                            Estimated blood loss was minimal.                           The exam was otherwise normal throughout the                            examined colon.                            The retroflexed view of the distal rectum and anal                            verge was normal and showed no anal or rectal                            abnormalities.                           Non-bleeding internal hemorrhoids were found during                            retroflexion. The hemorrhoids were large and Grade                            I (  internal hemorrhoids that do not prolapse).                           No additional abnormalities were found on                            retroflexion. Complications:            No immediate complications. Estimated Blood Loss:     Estimated blood loss was minimal. Impression:               - One 4 mm polyp in the ascending colon, removed                            with a cold snare. Resected and retrieved.                           - Three 3 to 12 mm polyps at the splenic flexure,                            removed with a cold snare. Resected and retrieved.                           - One 5 mm polyp in the descending colon, removed                            with a cold snare. Resected and retrieved.                           - The distal rectum and anal verge are normal on                            retroflexion view.                           - The wall thickening seen in the rectum and                            ascending colon may have been secondary to colitis,                            stool, or peristalsis. No mass lesion or other                            mucosa abnormality seen.                           - Non-bleeding internal hemorrhoids. Recommendation:           - Patient has a contact number available for                            emergencies. The signs and symptoms of potential  delayed complications were discussed with the                            patient. Return to normal activities tomorrow.                            Written discharge instructions were provided to the                             patient.                           - Resume previous diet.                           - Continue present medications.                           - Await pathology results.                           - Repeat colonoscopy (date not yet determined) for                            surveillance based on pathology results. Shonique Pelphrey E. Tomasa Rand, MD 02/18/2023 2:48:43 PM This report has been signed electronically.

## 2023-02-18 NOTE — Progress Notes (Signed)
History and Physical Interval Note:  02/18/2023 1:38 PM  Nicholas Espinoza  has presented today for endoscopic procedure(s), with the diagnosis of  Encounter Diagnoses  Name Primary?   Abnormal finding on GI tract imaging Yes   Family history of colorectal cancer   .  The various methods of evaluation and treatment have been discussed with the patient and/or family. After consideration of risks, benefits and other options for treatment, the patient has consented to  the endoscopic procedure(s).   The patient's history has been reviewed, patient examined, no change in status, stable for endoscopic procedure(s).  I have reviewed the patient's chart and labs.  Questions were answered to the patient's satisfaction.     Leigh Kaeding E. Tomasa Rand, MD [facility]

## 2023-02-18 NOTE — Op Note (Signed)
[facility] Patient Name: Nicholas Espinoza Procedure Date: 02/18/2023 1:39 PM MRN: [id number] Endoscopist: Lorin Picket E. Tomasa Rand , MD, [id number] Age: 62 Referring MD:  Date of Birth: Apr 06, 1961 Gender: Male Account #: [id number] Procedure:                Upper GI endoscopy Indications:              Abnormal CT of the GI tract (gastric wall                            thickening on CT in April); patient occasional                            abdominal pain Medicines:                Monitored Anesthesia Care Procedure:                Pre-Anesthesia Assessment:                           - Prior to the procedure, a History and Physical                            was performed, and patient medications and                            allergies were reviewed. The patient's tolerance of                            previous anesthesia was also reviewed. The risks                            and benefits of the procedure and the sedation                            options and risks were discussed with the patient.                            All questions were answered, and informed consent                            was obtained. Prior Anticoagulants: The patient has                            taken Plavix (clopidogrel), last dose was 5 days                            prior to procedure. ASA Grade Assessment: II - A                            patient with mild systemic disease. After reviewing                            the risks and benefits, the patient was deemed in  satisfactory condition to undergo the procedure.                           After obtaining informed consent, the endoscope was                            passed under direct vision. Throughout the                            procedure, the patient's blood pressure, pulse, and                            oxygen saturations were monitored continuously. The                            GIF HQ190 #[id number] was  introduced through the                            mouth, and advanced to the second part of duodenum.                            The upper GI endoscopy was accomplished without                            difficulty. The patient tolerated the procedure                            well. Scope In: Scope Out: Findings:                 The examined portions of the nasopharynx,                            oropharynx and larynx were normal.                           The examined esophagus was normal.                           Diffuse moderate mucosal changes characterized by                            nodularity and atrophy were found in the gastric                            body. Biopsies were taken with a cold forceps for                            histology. Estimated blood loss was minimal.                           Scattered mild mucosal changes characterized by                            erythema and erosion were found in the gastric  antrum. Biopsies were taken with a cold forceps for                            Helicobacter pylori testing. Estimated blood loss                            was minimal.                           Multiple 2 to 5 mm sessile yellowish/reddish                            polypoid were found in the gastric body. Biopsies                            were taken with a cold forceps for histology.                            Estimated blood loss was minimal.                           The exam of the stomach was otherwise normal.                           Scattered moderate inflammation characterized by                            erythema and nodularity was found in the duodenal                            bulb. Biopsies were taken with a cold forceps for                            histology. Estimated blood loss was minimal.                           A single 20 mm subepithelial cystic lesion was                            found in the second  portion of the duodenum. The                            lesion was soft when probed with forceps. This                            lesion was not biopsied due to possibility it may                            be vascular.                           The exam of the duodenum was otherwise normal. Complications:            No immediate complications. Estimated Blood Loss:  Estimated blood loss was minimal. Impression:               - The examined portions of the nasopharynx,                            oropharynx and larynx were normal.                           - Normal esophagus.                           - Nodular mucosa in the gastric body. Biopsied.                           - Erythematous and eroded mucosa in the antrum.                            Biopsied.                           - Multiple gastric polyps. Biopsied.                           - Duodenitis. Biopsied.                           - Subepithelial cystic lesion found in the duodenum. Recommendation:           - Patient has a contact number available for                            emergencies. The signs and symptoms of potential                            delayed complications were discussed with the                            patient. Return to normal activities tomorrow.                            Written discharge instructions were provided to the                            patient.                           - Resume previous diet.                           - Resume Plavix (clopidogrel) at prior dose in 2                            days.                           - Await pathology results.                           -  Will likely need EUS to further characterize                            cystic lesion in duodenum. Aloysious Vangieson E. Tomasa Rand, MD 02/18/2023 2:41:46 PM This report has been signed electronically.

## 2023-02-18 NOTE — Progress Notes (Signed)
Uneventful anesthetic. Report to pacu rn. Vss. Care resumed by rn. 

## 2023-02-18 NOTE — Patient Instructions (Signed)
Please read handouts provided. Continue present medications. Resume Plavix ( clopidogrel ) at prior dose in 2 days. Await pathology results.   YOU HAD AN ENDOSCOPIC PROCEDURE TODAY AT THE  ENDOSCOPY CENTER:   Refer to the procedure report that was given to you for any specific questions about what was found during the examination.  If the procedure report does not answer your questions, please call your gastroenterologist to clarify.  If you requested that your care partner not be given the details of your procedure findings, then the procedure report has been included in a sealed envelope for you to review at your convenience later.  YOU SHOULD EXPECT: Some feelings of bloating in the abdomen. Passage of more gas than usual.  Walking can help get rid of the air that was put into your GI tract during the procedure and reduce the bloating. If you had a lower endoscopy (such as a colonoscopy or flexible sigmoidoscopy) you may notice spotting of blood in your stool or on the toilet paper. If you underwent a bowel prep for your procedure, you may not have a normal bowel movement for a few days.  Please Note:  You might notice some irritation and congestion in your nose or some drainage.  This is from the oxygen used during your procedure.  There is no need for concern and it should clear up in a day or so.  SYMPTOMS TO REPORT IMMEDIATELY:  Following lower endoscopy (colonoscopy or flexible sigmoidoscopy):  Excessive amounts of blood in the stool  Significant tenderness or worsening of abdominal pains  Swelling of the abdomen that is new, acute  Fever of 100F or higher  Following upper endoscopy (EGD)  Vomiting of blood or coffee ground material  New chest pain or pain under the shoulder blades  Painful or persistently difficult swallowing  New shortness of breath  Fever of 100F or higher  Black, tarry-looking stools  For urgent or emergent issues, a gastroenterologist can be reached  at any hour by calling [phone]. Do not use MyChart messaging for urgent concerns.    DIET:  We do recommend a small meal at first, but then you may proceed to your regular diet.  Drink plenty of fluids but you should avoid alcoholic beverages for 24 hours.  ACTIVITY:  You should plan to take it easy for the rest of today and you should NOT DRIVE or use heavy machinery until tomorrow (because of the sedation medicines used during the test).    FOLLOW UP: Our staff will call the number listed on your records the next business day following your procedure.  We will call around 7:15- 8:00 am to check on you and address any questions or concerns that you may have regarding the information given to you following your procedure. If we do not reach you, we will leave a message.     If any biopsies were taken you will be contacted by phone or by letter within the next 1-3 weeks.  Please call us at [phone] if you have not heard about the biopsies in 3 weeks.    SIGNATURES/CONFIDENTIALITY: You and/or your care partner have signed paperwork which will be entered into your electronic medical record.  These signatures attest to the fact that that the information above on your After Visit Summary has been reviewed and is understood.  Full responsibility of the confidentiality of this discharge information lies with you and/or your care-partner.

## 2023-02-18 NOTE — Progress Notes (Signed)
VS by DT  Pt's states no medical or surgical changes since previsit or office visit.  

## 2023-02-19 ENCOUNTER — Telehealth: Payer: Self-pay | Admitting: *Deleted

## 2023-02-19 NOTE — Telephone Encounter (Signed)
  Follow up Call-     02/18/2023    1:15 PM  Call back number  Post procedure Call Back phone  # [phone]  Permission to leave phone message Yes     Patient questions:  Do you have a fever, pain , or abdominal swelling? No. Pain Score  0 *  Have you tolerated food without any problems? Yes.    Have you been able to return to your normal activities? Yes.    Do you have any questions about your discharge instructions: Diet   No. Medications  No. Follow up visit  No.  Do you have questions or concerns about your Care? No.  Actions: * If pain score is 4 or above: No action needed, pain <4.

## 2023-02-22 ENCOUNTER — Telehealth: Payer: Self-pay | Admitting: Licensed Clinical Social Worker

## 2023-02-22 NOTE — Telephone Encounter (Signed)
H&V Care Navigation CSW Progress Note  Clinical Social Worker contacted patient by phone again as requested to f/u after appt with Samara Deist as pt is uninsured. Also referred for additional care navigation team support. I was unable to reach pt again at [phone], left 3rd voicemail requesting pt call me back as able. I will mail pt copy of [facility] [employer] Assistance, [employer] and [employer] if applicable along with financial counseling number should pt be interested in submitting for assistance.   Patient is participating in a Managed Medicaid Plan:  No, self pay only  SDOH Screenings   Depression (PHQ2-9): Low Risk  (03/04/2022)  Tobacco Use: High Risk (02/18/2023)   Octavio Graves, MSW, LCSW Clinical Social Worker II [facility] Heart/Vascular Care Navigation  [phone]- work cell phone (preferred) [phone]- desk phone

## 2023-02-26 ENCOUNTER — Ambulatory Visit
Admission: RE | Admit: 2023-02-26 | Discharge: 2023-02-26 | Disposition: A | Discharge status: Home / Self Care | Payer: Self-pay | Source: Ambulatory Visit | Attending: Urgent Care | Admitting: Urgent Care

## 2023-02-26 ENCOUNTER — Ambulatory Visit: Payer: Self-pay

## 2023-02-26 VITALS — BP 123/84 | HR 72 | Temp 98.3°F | Resp 18

## 2023-02-26 DIAGNOSIS — I739 Peripheral vascular disease, unspecified: Secondary | ICD-10-CM

## 2023-02-26 MED ORDER — CILOSTAZOL 100 MG PO TABS: 100.0000 mg | ORAL_TABLET | Freq: Two times a day (BID) | ORAL | 0 refills | Status: DC

## 2023-02-26 MED ORDER — CILOSTAZOL 100 MG PO TABS
100.0000 mg | ORAL_TABLET | Freq: Two times a day (BID) | ORAL | 0 refills | Status: DC
  Filled 2023-02-26: qty 60, 30d supply, fill #0

## 2023-02-26 NOTE — ED Provider Notes (Signed)
[name]RGENT [facility] C[facility]  Note:  This document was prepared using Conservation officer, historic buildings and may include unintentional dictation errors.  MRN: [id number] DOB: 09-Sep-1960  Subjective:   Nicholas Espinoza is a 62 y.o. male presenting for 1 week history of persistent intermittent pain of his calf when he stands for prolonged period or starts to walk for long distance.  Symptoms are relieved with rest.  Patient has also experienced the symptoms in bed when he is lying flat.  No history of DVT.  No calf redness, calf swelling.  Currently smokes 1 pack per week.  Has known history of PAD, with abnormal ABI on the left is 0.87, status post peripheral angiography by Dr. Allyson Sabal on 09/02/2020 revealing highly calcified 90% left common iliac artery stenosis.  Has not followed up with Dr. Allyson Sabal.   Current Facility-Administered Medications:    sodium chloride flush (NS) 0.9 % injection 3 mL, 3 mL, Intravenous, Q12H, Runell Gess, MD  Current Outpatient Medications:    aspirin EC 81 MG tablet, Take 81 mg by mouth daily. Swallow whole., Disp: , Rfl:    clopidogrel (PLAVIX) 75 MG tablet, Take 1 tablet (75 mg total) by mouth daily with breakfast., Disp: 90 tablet, Rfl: 4   gabapentin (NEURONTIN) 300 MG capsule, TAKE 1 CAPSULE (300 MG TOTAL) BY MOUTH 3 (THREE) TIMES DAILY., Disp: 90 capsule, Rfl: 3   polyethylene glycol-electrolytes (NULYTELY) 420 g solution, USE AS DIRECTED, Disp: 4000 mL, Rfl: 0   rosuvastatin (CRESTOR) 20 MG tablet, Take 1 tablet (20 mg total) by mouth daily., Disp: 90 tablet, Rfl: 3   No Known Allergies  Past Medical History:  Diagnosis Date   Hyperlipidemia    Left ventricular diastolic dysfunction, NYHA class 1      Past Surgical History:  Procedure Laterality Date   ABDOMINAL AORTOGRAM W/LOWER EXTREMITY Bilateral 09/02/2020   Procedure: ABDOMINAL AORTOGRAM W/LOWER EXTREMITY;  Surgeon: Runell Gess, MD;  Location: [facility] INVASIVE CV LAB;  Service:  Cardiovascular;  Laterality: Bilateral;   DENTAL SURGERY     PERIPHERAL VASCULAR ATHERECTOMY Left 09/02/2020   Procedure: PERIPHERAL VASCULAR ATHERECTOMY;  Surgeon: Runell Gess, MD;  Location: [facility] INVASIVE CV LAB;  Service: Cardiovascular;  Laterality: Left;   PERIPHERAL VASCULAR INTERVENTION Left 09/02/2020   Procedure: PERIPHERAL VASCULAR INTERVENTION;  Surgeon: Runell Gess, MD;  Location: [facility] INVASIVE CV LAB;  Service: Cardiovascular;  Laterality: Left;    Family History  Problem Relation Age of Onset   Heart attack Mother    Cancer - Other Father    Diabetes Sister    Diabetes Sister    Colon cancer Brother    Heart attack Brother 69   Esophageal cancer Neg Hx    Rectal cancer Neg Hx    Stomach cancer Neg Hx     Social History   Tobacco Use   Smoking status: Some Days    Packs/day: 0.25    Years: 0.50    Additional pack years: 0.00    Total pack years: 0.13    Types: Cigarettes   Smokeless tobacco: Never   Tobacco comments:    less then 1 cigarette daily  Vaping Use   Vaping Use: Never used  Substance Use Topics   Alcohol use: Yes    Comment: occ   Drug use: Yes    Types: Marijuana    ROS   Objective:   Vitals: BP 123/84 (BP Location: Right Arm)   Pulse 72   Temp  98.3 F (36.8 C) (Oral)   Resp 18   SpO2 97%   Physical Exam Constitutional:      General: He is not in acute distress.    Appearance: Normal appearance. He is well-developed and normal weight. He is not ill-appearing, toxic-appearing or diaphoretic.  HENT:     Head: Normocephalic and atraumatic.     Right Ear: External ear normal.     Left Ear: External ear normal.     Nose: Nose normal.     Mouth/Throat:     Pharynx: Oropharynx is clear.  Eyes:     General: No scleral icterus.       Right eye: No discharge.        Left eye: No discharge.     Extraocular Movements: Extraocular movements intact.  Cardiovascular:     Rate and Rhythm: Normal rate.  Pulmonary:     Effort:  Pulmonary effort is normal.  Musculoskeletal:     Cervical back: Normal range of motion.     Right lower leg: No swelling, deformity, lacerations, tenderness or bony tenderness. No edema.     Left lower leg: No swelling, deformity, lacerations, tenderness or bony tenderness. No edema.     Comments: Negative Denna Haggard' sign bilateral.  No warmth, erythema, swelling of the lower extremities.  Neurological:     Mental Status: He is alert and oriented to person, place, and time.  Psychiatric:        Mood and Affect: Mood normal.        Behavior: Behavior normal.        Thought Content: Thought content normal.        Judgment: Judgment normal.     Assessment and Plan :   PDMP not reviewed this encounter.  1. Claudication of right lower extremity (HCC)   2. Peripheral arterial disease (HCC)    Patient has intermittent claudication of the right lower extremity, established history of peripheral arterial disease.  Recommended very close follow-up with Dr. Allyson Sabal.  In the meantime, can start a trial of Cilostazol 100mg . Labs pending. Counseled patient on potential for adverse effects with medications prescribed/recommended today, ER and return-to-clinic precautions discussed, patient verbalized understanding.    Nicholas Espinoza, [place] 02/26/23 [id number]

## 2023-02-26 NOTE — Telephone Encounter (Signed)
Oted. 

## 2023-02-26 NOTE — Discharge Instructions (Addendum)
Please make sure you follow-up with Dr. Allyson Sabal, for a recheck on your claudication.  This is entirely used for the calf pain that you are experiencing from peripheral vascular/arterial disease.  In the meantime you can try a medicine called Cilostazol.  This is okay to take with aspirin and clopidogrel (Plavix).  However, make sure you follow-up with Dr. Allyson Sabal as soon as possible.  I will let you know about your blood test results and if we need to make changes.

## 2023-02-26 NOTE — Telephone Encounter (Signed)
Chief Complaint: Leg Pain Symptoms: Right leg pain below the knee down to the ankle with weakness Frequency: Onset last Saturday Pertinent Negatives: Patient denies SOB, Chest pain, numbness and other symptoms Disposition: [] ED /[x] Urgent Care (no appt availability in office) / [] Appointment(In office/virtual)/ []  [facility]/ [] Home Care/ [] Refused Recommended Disposition /[] [facility]/ []  Follow-up with PCP Additional Notes: Patient reports right leg pain that has been ongoing since last Saturday. Patient states he had a colonoscopy last Thursday and the leg pain began shortly after that procedure. Patient states the pain is an 9/10 on the pain scale making it difficult to stand or walk more than 5 minutes at a time. Advised patient that he will need to be evaluated. Patient is agreeable. No appointments available in PCP office. Patient has been schedule today at urgent Care at 1815.   Reason for Disposition  [1] Thigh or calf pain AND [2] only 1 side AND [3] present > 1 hour (Exception: Chronic unchanged pain.)  Answer Assessment - Initial Assessment Questions 1. ONSET: "When did the pain start?"      About 1 week ago last saturday 2. LOCATION: "Where is the pain located?"      Right leg, below the knee to the ankle  3. PAIN: "How bad is the pain?"    (Scale 1-10; or mild, moderate, severe)   -  MILD (1-3): doesn't interfere with normal activities    -  MODERATE (4-7): interferes with normal activities (e.g., work or school) or awakens from sleep, limping    -  SEVERE (8-10): excruciating pain, unable to do any normal activities, unable to walk     9/10 on pain scale 4. WORK OR EXERCISE: "Has there been any recent work or exercise that involved this part of the body?"      No  5. CAUSE: "What do you think is causing the leg pain?"     No I'm not sure  6. OTHER SYMPTOMS: "Do you have any other symptoms?" (e.g., chest pain, back pain, breathing difficulty, swelling,  rash, fever, numbness, weakness)     When I am standing I feel weakness in the right leg  Protocols used: Leg Pain-A-AH

## 2023-02-26 NOTE — ED Triage Notes (Signed)
Pt c/o RLE pain when walking or standing-increase pain to right calf x 1 week-denies injury-NAD-steady gait

## 2023-02-27 ENCOUNTER — Other Ambulatory Visit: Payer: Self-pay

## 2023-02-27 LAB — BASIC METABOLIC PANEL
BUN/Creatinine Ratio: 18 (ref 10–24)
BUN: 19 mg/dL (ref 8–27)
CO2: 26 mmol/L (ref 20–29)
Calcium: 9.5 mg/dL (ref 8.6–10.2)
Chloride: 108 mmol/L — ABNORMAL HIGH (ref 96–106)
Creatinine, Ser: 1.03 mg/dL (ref 0.76–1.27)
Glucose: 85 mg/dL (ref 70–99)
Potassium: 4.7 mmol/L (ref 3.5–5.2)
Sodium: 145 mmol/L — ABNORMAL HIGH (ref 134–144)
eGFR: 83 mL/min/{1.73_m2} (ref >59)

## 2023-02-27 LAB — CK: Total CK: 113 U/L (ref 41–331)

## 2023-03-01 ENCOUNTER — Other Ambulatory Visit: Payer: Self-pay

## 2023-03-03 ENCOUNTER — Telehealth: Payer: Self-pay | Admitting: Licensed Clinical Social Worker

## 2023-03-03 ENCOUNTER — Encounter: Payer: Self-pay | Admitting: Gastroenterology

## 2023-03-03 NOTE — Progress Notes (Unsigned)
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  He presents today for follow-up regarding his PAD. Past Medical History    Past Medical History:  Diagnosis Date   Hyperlipidemia    Left ventricular diastolic dysfunction, NYHA class 1    Past Surgical History:  Procedure Laterality Date   ABDOMINAL AORTOGRAM W/LOWER EXTREMITY Bilateral 09/02/2020   Procedure: ABDOMINAL AORTOGRAM W/LOWER EXTREMITY;  Surgeon: Runell Gess, MD;  Location: [facility] INVASIVE CV LAB;  Service: Cardiovascular;  Laterality: Bilateral;   DENTAL SURGERY     PERIPHERAL VASCULAR ATHERECTOMY Left 09/02/2020   Procedure: PERIPHERAL VASCULAR ATHERECTOMY;  Surgeon: Runell Gess, MD;  Location: [facility] INVASIVE CV LAB;  Service: Cardiovascular;  Laterality: Left;   PERIPHERAL VASCULAR INTERVENTION Left 09/02/2020   Procedure: PERIPHERAL VASCULAR INTERVENTION;  Surgeon: Runell Gess, MD;  Location: [facility] INVASIVE CV LAB;  Service: Cardiovascular;  Laterality: Left;   Allergies  No Known Allergies  Labs/Other Studies Reviewed   The following studies were reviewed today: Cardiac Studies & Procedures       ECHOCARDIOGRAM  ECHOCARDIOGRAM COMPLETE 01/30/2020  Narrative ECHOCARDIOGRAM REPORT    Patient Name:   Nicholas Espinoza Date of Exam: 01/30/2020 Medical Rec #:  [id number]      Height:       65.0 in Accession #:    [id number]     Weight:       126.0 lb Date of Birth:  19-May-1961     BSA:          1.625 m Patient Age:    62 years       BP:           120/80 mmHg Patient Gender: M              HR:           80 bpm. Exam Location:  Outpatient  Procedure: 2D Echo, Cardiac Doppler and Color Doppler  Indications:    R94.31 Abnormal EKG  History:        Patient has no  prior history of Echocardiogram examinations.  Sonographer:    Elmarie Shiley Dance Referring Phys: [phone] [facility]   Sonographer Comments: Technically difficult study due to poor echo windows. Image acquisition challenging due to patient body habitus. IMPRESSIONS   1. Left ventricular ejection fraction, by estimation, is 65 to 70%. The left ventricle has normal function. The left ventricle has no regional wall motion abnormalities. Left ventricular diastolic parameters are consistent with Grade I diastolic dysfunction (impaired relaxation). 2. Right ventricular systolic function is normal. The right ventricular size is normal. 3. The mitral valve is normal in structure. Trivial mitral valve regurgitation. No evidence of mitral stenosis. 4. The aortic valve is normal in structure. Aortic valve regurgitation is not visualized. No aortic stenosis is present. 5. The inferior vena cava is normal in size with greater than 50% respiratory variability, suggesting right atrial pressure of 3 mmHg.  FINDINGS Left Ventricle: Left ventricular ejection fraction, by estimation, is 65 to 70%. The left ventricle has normal function. The left ventricle has no regional wall motion abnormalities. The left ventricular internal cavity size was normal in size. There is no left ventricular hypertrophy. Left ventricular diastolic parameters are consistent with Grade  I diastolic dysfunction (impaired relaxation). Normal left ventricular filling pressure.  Right Ventricle: The right ventricular size is normal. No increase in right ventricular wall thickness. Right ventricular systolic function is normal.  Left Atrium: Left atrial size was normal in size.  Right Atrium: Right atrial size was normal in size.  Pericardium: There is no evidence of pericardial effusion.  Mitral Valve: The mitral valve is normal in structure. Normal mobility of the mitral valve leaflets. Trivial mitral valve regurgitation. No evidence  of mitral valve stenosis.  Tricuspid Valve: The tricuspid valve is normal in structure. Tricuspid valve regurgitation is mild . No evidence of tricuspid stenosis.  Aortic Valve: The aortic valve is normal in structure. Aortic valve regurgitation is not visualized. No aortic stenosis is present.  Pulmonic Valve: The pulmonic valve was normal in structure. Pulmonic valve regurgitation is not visualized. No evidence of pulmonic stenosis.  Aorta: The aortic root is normal in size and structure.  Venous: The inferior vena cava is normal in size with greater than 50% respiratory variability, suggesting right atrial pressure of 3 mmHg.  IAS/Shunts: No atrial level shunt detected by color flow Doppler.   LEFT VENTRICLE PLAX 2D LVIDd:         3.00 cm  Diastology LVIDs:         2.30 cm  LV e' lateral:   9.25 cm/s LV PW:         0.80 cm  LV E/e' lateral: 10.1 LV IVS:        0.80 cm  LV e' medial:    6.53 cm/s LVOT diam:     2.00 cm  LV E/e' medial:  14.3 LV SV:         70 LV SV Index:   43 LVOT Area:     3.14 cm   RIGHT VENTRICLE             IVC RV Basal diam:  2.40 cm     IVC diam: 1.30 cm RV S prime:     13.80 cm/s TAPSE (M-mode): 2.4 cm  LEFT ATRIUM             Index LA diam:        2.10 cm 1.29 cm/m LA Vol (A2C):   44.3 ml 27.25 ml/m LA Vol (A4C):   20.1 ml 12.37 ml/m LA Biplane Vol: 30.8 ml 18.95 ml/m AORTIC VALVE LVOT Vmax:   136.00 cm/s LVOT Vmean:  79.800 cm/s LVOT VTI:    0.223 m  AORTA Ao Root diam: 3.30 cm Ao Asc diam:  3.10 cm  MITRAL VALVE MV Area (PHT): 3.17 cm    SHUNTS MV Decel Time: 239 msec    Systemic VTI:  0.22 m MV E velocity: 93.60 cm/s  Systemic Diam: 2.00 cm MV A velocity: 73.50 cm/s MV E/A ratio:  1.27  Tobias Alexander MD Electronically signed by Tobias Alexander MD Signature Date/Time: 01/31/2020/4:52:07 PM    Final    MONITORS  LONG TERM MONITOR (3-14 DAYS) 03/12/2020  Narrative Enrollment 02/16/2020-02/22/2020 (5 days 23 hours).  Patient had a min HR of 47 bpm (sinus bradycardia), max HR of 139 bpm (sinus tachycardia), and avg HR of 79 bpm (normal sinus rhythm). Predominant underlying rhythm was Sinus Rhythm. Isolated SVEs were rare (<1.0%), SVE Triplets were rare (<1.0%), and no SVE Couplets were present. Isolated VEs were rare (<1.0%), VE Couplets were rare (<1.0%), and no VE Triplets were present. No diary submitted.  Impression:  1. No arrhythmias detected. 2.  Rare ectopy.  Gerri Spore T. Flora Lipps, MD [facility] [address] [place], [place] [address] [phone] 2:18 PM   CT SCANS  CT CORONARY MORPH W/CTA COR W/SCORE 07/08/2020  Addendum 07/08/2020  5:22 PM ADDENDUM REPORT: 07/08/2020 17:19  CLINICAL DATA:  Chest pain  EXAM: Cardiac CTA  MEDICATIONS: Sub lingual nitro. 4 mg and lopressor 50mg   TECHNIQUE: The patient was scanned on a [employer] 192 scanner. Gantry rotation speed was 250 msecs. Collimation was. 6 mm . A 120 kV prospective scan was triggered in the ascending thoracic aorta at 140 HU's with full mA between 30-70% of the R-R interval . Average HR during the scan was bpm. The 3D data set was interpreted on a dedicated work station using MPR, MIP and VRT modes. A total of 80 cc of contrast was used.  FINDINGS: Non-cardiac: See separate report from [facility]. No significant findings on limited lung and soft tissue windows.  Calcium score: Calcium noted in proximal LAD  Coronary Arteries: Right dominant with no anomalies  LM: Normal  LAD: 1-24% calcified ostial LAD 1-24% mixed plaque in proximal vessel with external remodeling  IM: Medium sized vessel normal  D1: Normal  D2: Normal  Circumflex: Normal  OM1: Normal  AV Groove: Normal  RCA: Normal  PDA: Normal  PLA: Normal  IMPRESSION: 1.  Calcium score 53 which is 63 rd percentile for age / sex  2.   Normal aortic root 3.5 cm  3.   CAD RADS 1 non-obstructive CAD in  LAD  Charlton Haws   Electronically Signed By: Charlton Haws M.D. On: 07/08/2020 17:19  Narrative EXAM: OVER-READ INTERPRETATION  CT CHEST  The following report is an over-read performed by radiologist Dr. Trudie Reed of [facility] on 07/08/2020. This over-read does not include interpretation of cardiac or coronary anatomy or pathology. The coronary calcium score/coronary CTA interpretation by the cardiologist is attached.  COMPARISON:  None.  FINDINGS: Within the visualized portions of the thorax there are no suspicious appearing pulmonary nodules or masses, there is no acute consolidative airspace disease, no pleural effusions, no pneumothorax and no lymphadenopathy. Visualized portions of the upper abdomen are unremarkable. There are no aggressive appearing lytic or blastic lesions noted in the visualized portions of the skeleton.  IMPRESSION: 1. No significant incidental noncardiac findings are noted.  Electronically Signed: By: Trudie Reed M.D. 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   Seen by Dr. Flora Lipps in 01/2020 for abnormal EKG [place] for type I Brugada pattern.  He had no symptoms to suggest Brugada syndrome.  Echo at that time indicated an EF of 60 to 65%.  He wore a Zio patch that demonstrated only infrequent isolated supraventricular and ventricular ectopy.  He was seen by  Dr. Graciela Husbands on 03/2020 regarding Brugada type ECG he felt as he did not have symptoms  he was classified as not having Brugada syndrome but having a Brugada type ECG.  He underwent coronary CTA in 06/2020 which showed a calcium score of 53, which was 63rd percentile, he had nonobstructive CAD in the LAD.   On 09/02/2020 Dr. Allyson Sabal performed a peripheral angiography revealing a highly calcified 90% left common iliac artery stenosis.  He performed orbital atherectomy, PTA and covered stenting using an 8 mm x 59 mm long VBX covered stent.  His postprocedure Doppler studies revealed normal left ABI with normal velocities, his left lower extremity claudication was resolved.  At that time he did report having some right leg pain that Dr. Allyson Sabal felt was more neurogenic in nature.  He was last seen in office on 02/09/2023 by Zonia Kief for preoperative cardiac clearance for EGD and colonoscopy.  Reportedly lost approximately 10 pounds and had been noting increased abdominal pain and low-grade nausea.  He was instructed to hold aspirin and Plavix 5 days prior to the procedure and to restart as soon as hemodynamically stable.  On chart review it appears he presented to [facility] on 02/26/23 for a week of persistent intermittent pain of the right calf when standing for long periods or walking long distances, pain is relieved with rest.  He was felt to have intermittent claudication of the right lower extremity and was started on Cilostazol 100mg  daily.   Today he presents for right lower extremity pain that occurs on exertion or when standing for long periods. Notes this can occur with rest when sitting in specific positions but he associates more with exertion. Typically pain resolves with rest or stopping of activity. Describes the pain as an ache. He was given Cilostazol by urgent care, felt this resulted in headaches. He denies swelling, feeling numbness or shooting pain the leg. Reports he had  otherwise been doing well. He denies chest pain, palpitations, dyspnea, pnd, orthopnea, n, v, dizziness, syncope, edema, weight gain, or early satiety.   Home Medications    Current Outpatient Medications  Medication Sig Dispense Refill   aspirin EC 81 MG tablet Take 81 mg by mouth daily. Swallow whole.     clopidogrel (PLAVIX) 75 MG tablet Take 1 tablet (75 mg total) by mouth daily with breakfast. 90 tablet 4   gabapentin (NEURONTIN) 300 MG capsule TAKE 1 CAPSULE (300 MG TOTAL) BY MOUTH 3 (THREE) TIMES DAILY. 90 capsule 3   rosuvastatin (CRESTOR) 20 MG tablet Take 1 tablet (20 mg total) by mouth daily. 90 tablet 3   cilostazol (PLETAL) 100 MG tablet Take 1 tablet (100 mg total) by mouth 2 (two) times daily. 60 tablet 0   polyethylene glycol-electrolytes (NULYTELY) 420 g solution USE AS DIRECTED (Patient not taking: Reported on 03/04/2023) 4000 mL 0   Current Facility-Administered Medications  Medication Dose Route Frequency Provider Last Rate Last Admin   sodium chloride flush (NS) 0.9 % injection 3 mL  3 mL Intravenous Q12H Runell Gess, MD        Review of Systems  He denies chest pain, palpitations, dyspnea, pnd, orthopnea, n, v, dizziness, syncope, edema, weight gain, or early satiety. All other systems reviewed and are otherwise negative except as noted above.     Physical Exam    VS:  BP 94/60   Pulse 83   Ht 5\' 9"  (1.753 m)   Wt 132 lb 6.4 oz (60.1 kg)   SpO2 93%   BMI 19.55 kg/m  , BMI Body mass index is 19.55  kg/m.     GEN: Well nourished, well developed, in no acute distress. HEENT: normal. Neck: Supple, no JVD, carotid bruits, or masses. Cardiac: RRR, no murmurs, rubs, or gallops. No clubbing, cyanosis, edema.  Radials/DP/PT 2+ and equal bilaterally.  Respiratory:  Respirations regular and unlabored, clear to auscultation bilaterally. GI: Soft, nontender, nondistended, BS + x 4. MS: no deformity or atrophy. Skin: warm and dry, no rash. Neuro:  Strength and  sensation are intact. Psych: Normal affect.  Accessory Clinical Findings  No EKG ordered today.  Lab Results  Component Value Date   WBC 6.0 02/09/2023   HGB 12.9 (L) 02/09/2023   HCT 39.3 02/09/2023   MCV 96 02/09/2023   PLT 154 02/09/2023   Lab Results  Component Value Date   CREATININE 1.03 02/26/2023   BUN 19 02/26/2023   NA 145 (H) 02/26/2023   K 4.7 02/26/2023   CL 108 (H) 02/26/2023   CO2 26 02/26/2023   Lab Results  Component Value Date   ALT 19 11/24/2022   AST 19 11/24/2022   ALKPHOS 65 11/24/2022   BILITOT 0.7 11/24/2022   Lab Results  Component Value Date   CHOL 169 05/01/2020   HDL 54 05/01/2020   LDLCALC 100 (H) 05/01/2020   TRIG 79 05/01/2020   CHOLHDL 3.1 05/01/2020    Lab Results  Component Value Date   HGBA1C 5.4 01/24/2020   Assessment & Plan   PAD: 09/02/2020 Dr. Allyson Sabal performed a peripheral angiography revealing a highly calcified 90% left common iliac artery stenosis.  He performed orbital atherectomy, PTA and covered stenting using an 8 mm x 59 mm long VBX covered stent. Follow up ABI in 08/2020 indicated resting right ankle-brachial index within normal range,no evidence of significant right lower extremity arterial disease. Resting left ABI within normal range, no evidence of significant left lower extremity arterial disease. Presented to urgent care earlier this week with right lower extremity intermittent claudication, started on Cilostazol 100mg  daily. Today reports intermittent right lower extremity pain with exertion, occasionally will have when sitting in specific position. Pain relieves with rest. Stopped Cilostazol as it gave him headaches. Bilateral lower extremities are warm and dry, PT/DP pulses +2, no wounds present, no edema. Check bilateral ABI/LEA.   Nonobstructive CAD: Coronary CTA in 06/2020 which showed a calcium score of 53, which was 63rd percentile, he had nonobstructive CAD in the LAD. Stable with no anginal symptoms. No  indication for ischemic evaluation. Continue aspirin, plavix, rosuvastatin 20mg  daily. Check fasting lipid panel and LFTs.   Hypotension: Blood pressure today 94/60, he is asymptomatic. Reports his blood pressure has been running low since weight loss. Denies dizziness or lightheadedness. Recommended increased hydration.   Tobacco abuse: Currently smoking one cigarette a day. Previously referred to [facility] tobacco cessation program. Not interested in cessation at this time.   Hypercholesterolemia: No recent lipid profile. Continue rosuvastatin 20mg  daily. Check fasting lipids and LFTs.   Disposition: Follow up with Dr. Allyson Sabal following ABI/LEA.     Rip Harbour, NP 03/04/2023, 2:32 PM

## 2023-03-03 NOTE — Telephone Encounter (Signed)
H&V Care Navigation CSW Progress Note  Clinical Social Worker contacted patient by phone to f/u on assistance applications sent to him. Left voicemail, was contacted by pt to return call from [phone], I inquired if he had reiceved applications, he has not at this time. I requested he look for them and if still not received today I will f/u and provide pt with information tomorrow during appt. Pt aware of appt, plans to attend. Will f/u as able tomorrow.  Patient is participating in a Managed Medicaid Plan:  No, self pay only  SDOH Screenings   Depression (PHQ2-9): Low Risk  (03/04/2022)  Tobacco Use: High Risk (02/26/2023)   Octavio Graves, MSW, LCSW Clinical Social Worker II [facility] Heart/Vascular Care Navigation  [phone]- work cell phone (preferred) [phone]- desk phone

## 2023-03-04 ENCOUNTER — Ambulatory Visit: Payer: Self-pay | Attending: General Practice | Admitting: Cardiology

## 2023-03-04 ENCOUNTER — Encounter: Payer: Self-pay | Admitting: Cardiology

## 2023-03-04 VITALS — BP 94/60 | HR 83 | Ht 69.0 in | Wt 132.4 lb

## 2023-03-04 DIAGNOSIS — E7800 Pure hypercholesterolemia, unspecified: Secondary | ICD-10-CM

## 2023-03-04 DIAGNOSIS — I959 Hypotension, unspecified: Secondary | ICD-10-CM

## 2023-03-04 DIAGNOSIS — I739 Peripheral vascular disease, unspecified: Secondary | ICD-10-CM

## 2023-03-04 NOTE — Progress Notes (Signed)
Heart and Vascular Care Navigation  03/04/2023  Nicholas Espinoza Dec 10, 1960 [id number]  Reason for Referral: SDOH Screening/Assistance Applications Patient is participating in a Managed Medicaid Plan:No, self pay only  Engaged with patient face to face for initial visit for Heart and Vascular Care Coordination.                                                                                                   Assessment:        LCSW spoke with pt after appt today with APP team. Confirmed home address, PCP, and contact number. Pt was finally able to receive paperwork sent from our office and brings it in today. I reviewed it with him and he is agreeable to referral to DSS caseworker. He understands if ineligible for Medicaid we will assist with alternate assistance applications. He denies any challenges with costs of living such as housing, medications, utilities and transportation. Pt appreciative of assistance and check in from this writer.                           HRT/VAS Care Coordination     Patients Home Cardiology Office [facility]   Outpatient Care Team Social Worker   Social Worker Name: Octavio Graves, [place], [phone]   Living arrangements for the past 2 months Single Family Home   Lives with: Self   Patient Current Insurance Coverage Self-Pay   Patient Has Concern With Paying Medical Bills Yes   Patient Concerns With Medical Bills uninsured   Medical Bill Referrals: will assess for Medicaid and CAFA   Does Patient Have Prescription Coverage? No       Social History:                                                                             SDOH Screenings   Food Insecurity: No Food Insecurity (03/04/2023)  Housing: Low Risk  (03/04/2023)  Transportation Needs: No Transportation Needs (03/04/2023)  Utilities: Not At Risk (03/04/2023)  Depression (PHQ2-9): Low Risk  (03/04/2022)  Financial Resource Strain: Low Risk  (03/04/2023)  Tobacco Use: High Risk (02/26/2023)     SDOH Interventions: Financial Resources:  Financial Strain Interventions: Other (Comment) (referred to be assessed for Medicaid, if ineligible then will be assisted to complete [facility])  Food Insecurity:  Food Insecurity Interventions: Intervention Not Indicated  Housing Insecurity:  Housing Interventions: Intervention Not Indicated  Transportation:   Transportation Interventions: Intervention Not Indicated   Health Promotion Interventions:     Health Coaching Patient referred to Care Guide for health coaching regarding smoking cessation and declines at this time   Other Care Navigation Interventions:     Provided Pharmacy assistance resources  Referred for Medicaid screening, if ineligible with review medication assistance  options   Follow-up plan:   Pt declines additional referral to tobacco cessation counseling with Health Coach at this time, he understands we are able to make that referral again if needed.  LCSW reviewed paperwork with pt for Medicaid, [facility] [employer] Assistance and [employer]. Pt agreeable to me sending his name and phone number to Cathlyn Parsons, DSS Caseworker with [facility] remote office at [facility] to assess for [facility] eligibility. If ineligible for Medicaid will review and complete assistance applications with pt.

## 2023-03-04 NOTE — Patient Instructions (Addendum)
Medication Instructions:  The current medical regimen is effective;  continue present plan and medications as directed. Please refer to the Current Medication list given to you today.  *If you need a refill on your cardiac medications before your next appointment, please call your pharmacy*  Lab Work: FASTING LIPID AND LFT If you have labs (blood work) drawn today and your tests are completely normal, you will receive your results only by:  MyChart Message (if you have MyChart) OR  A paper copy in the mail If you have any lab test that is abnormal or we need to change your treatment, we will call you to review the results.   Testing/Procedures: SCHEDULE ABI/LEA   Follow-Up: At [facility], you and your health needs are our priority.  As part of our continuing mission to provide you with exceptional heart care, we have created designated Provider Care Teams.  These Care Teams include your primary Cardiologist (physician) and Advanced Practice Providers (APPs -  Physician Assistants and Nurse Practitioners) who all work together to provide you with the care you need, when you need it.  Your next appointment:   AFTER ABI/LEA   Provider:   DR Allyson Sabal     Other Instructions

## 2023-03-10 ENCOUNTER — Telehealth: Payer: Self-pay

## 2023-03-10 ENCOUNTER — Other Ambulatory Visit: Payer: Self-pay

## 2023-03-10 ENCOUNTER — Telehealth: Payer: Self-pay | Admitting: Licensed Clinical Social Worker

## 2023-03-10 DIAGNOSIS — R933 Abnormal findings on diagnostic imaging of other parts of digestive tract: Secondary | ICD-10-CM

## 2023-03-10 DIAGNOSIS — K319 Disease of stomach and duodenum, unspecified: Secondary | ICD-10-CM

## 2023-03-10 NOTE — Telephone Encounter (Signed)
EUS has been scheduled for 05/31/23 at 9 am at [facility] with GM.

## 2023-03-10 NOTE — Telephone Encounter (Signed)
     Primary Cardiologist: Reatha Harps, MD  Chart reviewed as part of pre-operative protocol coverage. Given past medical history and time since last visit, based on ACC/AHA guidelines, Nicholas Espinoza would be at acceptable risk for the planned procedure without further cardiovascular testing.   His Plavix may be held for 5 days prior to his procedure.  Please resume as soon as hemostasis is achieved.  Patient reports that he is no longer taking Pletal.  I will route this recommendation to the requesting party via Epic fax function and remove from pre-op pool.  Please call with questions.  Thomasene Ripple. Mariela Rex NP-C     03/10/2023, 4:32 PM [facility] [address] Office [phone] Fax [phone]

## 2023-03-10 NOTE — Telephone Encounter (Signed)
H&V Care Navigation CSW Progress Note  Clinical Social Worker contacted patient by phone to f/u on referral to [facility] caseworker and assistance apps. No answer at [phone], will text pt tomorrow to f/u as left voicemail today. No notes on NCTracks as of this time.   Patient is participating in a Managed Medicaid Plan:  No, self pay only.  SDOH Screenings   Food Insecurity: No Food Insecurity (03/04/2023)  Housing: Low Risk  (03/04/2023)  Transportation Needs: No Transportation Needs (03/04/2023)  Utilities: Not At Risk (03/04/2023)  Depression (PHQ2-9): Low Risk  (03/04/2022)  Financial Resource Strain: Low Risk  (03/04/2023)  Tobacco Use: High Risk (03/04/2023)    Octavio Graves, MSW, LCSW Clinical Social Worker II [facility] Heart/Vascular Care Navigation  [phone]- work cell phone (preferred) [phone]- desk phone

## 2023-03-10 NOTE — Telephone Encounter (Signed)
[facility] Pre-operative Risk Assessment     Request for surgical clearance:     Endoscopy Procedure  What type of surgery is being performed?     EUS   When is this surgery scheduled?     05/31/23  What type of clearance is required ?   Pharmacy  Are there any medications that need to be held prior to surgery and how long? Plavix, Pletal   Practice name and name of physician performing surgery?       Gastroenterology  What is your office phone and fax number?      Phone- [phone]  Fax- [phone]  Anesthesia type (None, local, MAC, general) ?       MAC

## 2023-03-10 NOTE — Telephone Encounter (Signed)
-----   Message from [facility] sent at 03/10/2023 12:39 PM EDT ----- Regarding: RE: EUS? SEC, Thanks for the update.  Calum Cormier, This patient needs an upper EUS with FNA/FNB possible and have forward linear and radial and normal linear available. May schedule nonurgently in the next 3 months. Thanks. GM ----- Message ----- From: Jenel Lucks, MD Sent: 03/09/2023   4:24 PM EDT To: Lemar Lofty., MD Subject: RE: EUS?                                       GM,  I spoke with Mr. Venard today and he would like to proceed with the EUS.  Thanks again,  [facility] ----- Message ----- From: Lemar Lofty., MD Sent: 03/03/2023   1:23 PM EDT To: Jenel Lucks, MD Subject: RE: EUS?                                       SEC, Not unreasonable for EUS. If patient is interested, reply back to me and I will forward to Rashaad Hallstrom. Thanks. GM ----- Message ----- From: Jenel Lucks, MD Sent: 03/03/2023   6:22 AM EDT To: Lemar Lofty., MD Subject: EUS?                                           GM,  Incidentally found this lesion in the duodenum.  Looked fluid filled, so did not biopsy in case it was an atypical appearing varix.  Would you consider an EUS to further characterize?  Thanks,  [place]

## 2023-03-11 ENCOUNTER — Telehealth (HOSPITAL_BASED_OUTPATIENT_CLINIC_OR_DEPARTMENT_OTHER): Payer: Self-pay | Admitting: Licensed Clinical Social Worker

## 2023-03-11 NOTE — Telephone Encounter (Signed)
Left message on machine to call back   See note that plavix is ok to hold per provider

## 2023-03-11 NOTE — Telephone Encounter (Signed)
H&V Care Navigation CSW Progress Note  Clinical Social Worker contacted patient by phone to f/u on referral to and assistance applications. Pt was reached today at [phone]; he shares that he checks his voicemail but didn't have any missed calls from DSS. I share that I also have called and left him several voicemails and encouraged him to continue to check and return calls as able. He requests I reach back out to DSS to see if they can call him again. Encouraged him to have his phone available.   Patient is participating in a Managed Medicaid Plan:  No, self pay only  SDOH Screenings   Food Insecurity: No Food Insecurity (03/04/2023)  Housing: Low Risk  (03/04/2023)  Transportation Needs: No Transportation Needs (03/04/2023)  Utilities: Not At Risk (03/04/2023)  Depression (PHQ2-9): Low Risk  (03/04/2022)  Financial Resource Strain: Low Risk  (03/04/2023)  Tobacco Use: High Risk (03/04/2023)   Nicholas Espinoza, MSW, LCSW Clinical Social Worker II [facility] Heart/Vascular Care Navigation  [phone]- work cell phone (preferred) [phone]- desk phone

## 2023-03-11 NOTE — Telephone Encounter (Signed)
Inbound call from patient returning phone call. Requesting a call back. Please advise, thank you.  

## 2023-03-12 NOTE — Telephone Encounter (Signed)
Left message on machine to call back  

## 2023-03-12 NOTE — Telephone Encounter (Signed)
EUS scheduled, pt instructed and medications reviewed.  Patient instructions mailed to home.  Patient to call with any questions or concerns.  The pt has also been advised of the plavix  hold.  He will call if he has any further questions.

## 2023-03-16 ENCOUNTER — Telehealth: Payer: Self-pay | Admitting: Cardiovascular Disease

## 2023-03-16 ENCOUNTER — Other Ambulatory Visit: Payer: Self-pay

## 2023-03-16 DIAGNOSIS — E7800 Pure hypercholesterolemia, unspecified: Secondary | ICD-10-CM

## 2023-03-16 DIAGNOSIS — I739 Peripheral vascular disease, unspecified: Secondary | ICD-10-CM

## 2023-03-16 LAB — LIPID PANEL
Chol/HDL Ratio: 2.7 ratio (ref 0.0–5.0)
Cholesterol, Total: 150 mg/dL (ref 100–199)
HDL: 56 mg/dL (ref >39)
LDL Chol Calc (NIH): 80 mg/dL (ref 0–99)
Triglycerides: 74 mg/dL (ref 0–149)
VLDL Cholesterol Cal: 14 mg/dL (ref 5–40)

## 2023-03-16 LAB — HEPATIC FUNCTION PANEL
ALT: 26 IU/L (ref 0–44)
AST: 21 IU/L (ref 0–40)
Albumin: 4.4 g/dL (ref 3.8–4.9)
Alkaline Phosphatase: 67 IU/L (ref 44–121)
Bilirubin Total: 0.7 mg/dL (ref 0.0–1.2)
Bilirubin, Direct: 0.19 mg/dL (ref 0.00–0.40)
Total Protein: 7.1 g/dL (ref 6.0–8.5)

## 2023-03-16 MED ORDER — ROSUVASTATIN CALCIUM 40 MG PO TABS
40.0000 mg | ORAL_TABLET | Freq: Every day | ORAL | 3 refills | Status: DC
  Filled 2023-03-16: qty 90, 90d supply, fill #0
  Filled 2023-06-28: qty 90, 90d supply, fill #1

## 2023-03-16 NOTE — Telephone Encounter (Signed)
Spoke with patient and gave results.  Labs and new RX ordered.  Repeated lab instructions and patient states understanding

## 2023-03-16 NOTE — Telephone Encounter (Signed)
Patient is returning call to discuss lab results. 

## 2023-03-17 ENCOUNTER — Other Ambulatory Visit (HOSPITAL_BASED_OUTPATIENT_CLINIC_OR_DEPARTMENT_OTHER): Payer: Self-pay

## 2023-03-17 ENCOUNTER — Other Ambulatory Visit: Payer: Self-pay

## 2023-03-18 ENCOUNTER — Ambulatory Visit (HOSPITAL_COMMUNITY)
Admission: RE | Admit: 2023-03-18 | Discharge: 2023-03-18 | Disposition: A | Discharge status: Home / Self Care | Payer: Self-pay | Source: Ambulatory Visit | Attending: Internal Medicine | Admitting: Internal Medicine

## 2023-03-18 DIAGNOSIS — I739 Peripheral vascular disease, unspecified: Secondary | ICD-10-CM | POA: Insufficient documentation

## 2023-03-18 LAB — VAS US ABI WITH/WO TBI
Left ABI: 1.24
Right ABI: 1.29

## 2023-03-19 ENCOUNTER — Telehealth: Payer: Self-pay | Admitting: Gastroenterology

## 2023-03-19 ENCOUNTER — Telehealth: Payer: Self-pay

## 2023-03-19 ENCOUNTER — Telehealth: Payer: Self-pay | Admitting: Licensed Clinical Social Worker

## 2023-03-19 NOTE — Telephone Encounter (Signed)
Patient returned call. He reports approximately 24 hours post colonoscopy on 6/27 that he developed a pain in his right leg from the knee down. He describes this as a "hurting pain".  He is not sure how to completely characterize it.  At times it is a stabbing pain worse after prolonged standing or ambulation. He was evaluated with Cardiology yesterday and had a vascular ultrasound of the lower extremity.  Patient states he was notified that this was okay and asked to  Contact Dr. Milas Hock office.  He was told that the pain may be related to positioning during the colonoscopy. He has tried OTC pain relievers with little improvement. Dr. Tomasa Rand please see notes below from OV with [facility] on 03/04/23.  "On chart review it appears he presented to [facility] on 02/26/23 for a week of persistent intermittent pain of the right calf when standing for long periods or walking long distances, pain is relieved with rest.  He was felt to have intermittent claudication of the right lower extremity and was started on Cilostazol 100mg  daily.    Today he presents for right lower extremity pain that occurs on exertion or when standing for long periods. Notes this can occur with rest when sitting in specific positions but he associates more with exertion. Typically pain resolves with rest or stopping of activity. Describes the pain as an ache. He was given Cilostazol by urgent care, felt this resulted in headaches. He denies swelling, feeling numbness or shooting pain the leg. Reports he had otherwise been doing well. He denies chest pain, palpitations, dyspnea, pnd, orthopnea, n, v, dizziness, syncope, edema, weight gain, or early satiety. "  Please advise any recommendations.

## 2023-03-19 NOTE — Telephone Encounter (Signed)
Left message for patient to call back  

## 2023-03-19 NOTE — Telephone Encounter (Signed)
PT had a colonscopy in June and ever since he has had problems with pain in his right leg. Requesting call back to discuss symptoms.

## 2023-03-19 NOTE — Telephone Encounter (Signed)
Thank you for letting me know, please let him know I am sorry that he was not satisfied. I have reviewed all of the results again and all of the data was put in. Dr. Allyson Sabal will also review all of the scans on his follow up and if he feels they are not adequate will address at that point.

## 2023-03-19 NOTE — Telephone Encounter (Signed)
Please read the comment below that was relayed to the pt by this nurse.  Thank you for letting me know, please let him know I am sorry that he was not satisfied. I have reviewed all of the results again and all of the data was put in. Dr. Allyson Sabal will also review all of the scans on his follow up and if he feels they are not adequate will address at that point.

## 2023-03-19 NOTE — Telephone Encounter (Signed)
H&V Care Navigation CSW Progress Note  Clinical Social Worker contacted patient by phone to f/u on referral to [facility] caseworker and assistance apps. No answer at [phone], left voicemail today. No notes on NCTracks as of this time. I have emailed caseworker Joi with [facility] DSS to see if she has screened pt at this time.    Patient is participating in a Managed Medicaid Plan:  No, self pay only.    SDOH Screenings   Food Insecurity: No Food Insecurity (03/04/2023)  Housing: Low Risk  (03/04/2023)  Transportation Needs: No Transportation Needs (03/04/2023)  Utilities: Not At Risk (03/04/2023)  Depression (PHQ2-9): Low Risk  (03/04/2022)  Financial Resource Strain: Low Risk  (03/04/2023)  Tobacco Use: High Risk (03/04/2023)    Octavio Graves, MSW, LCSW Clinical Social Worker II [facility] Heart/Vascular Care Navigation  [phone]- work cell phone (preferred) [phone]- desk phone

## 2023-03-19 NOTE — Progress Notes (Signed)
Called pt with results. Verbalized understanding but stating he was not satisfied with the person doing the test as "she did not perform it the same as other people have, she just did one swipe down the inside of my leg". Pt wants NP to know about this and advise.

## 2023-03-22 NOTE — Telephone Encounter (Signed)
Spoke with pt and he states that cardiology already did the [place] and he does not have a DVT. He reports the leg pain has gotten better. He will see ortho if the pain persists.

## 2023-03-26 NOTE — Progress Notes (Signed)
Pt advised of his results. Pt verbalizes understanding.

## 2023-04-12 ENCOUNTER — Telehealth: Payer: Self-pay | Admitting: Licensed Clinical Social Worker

## 2023-04-12 NOTE — Telephone Encounter (Signed)
H&V Care Navigation CSW Progress Note  Clinical Social Worker contacted patient by phone to f/u on Medicaid screening. [facility] DSS has attempted to reach pt several times to screen him and have been unsuccessful. Pt answered today at [phone]. He states he is at lunch and can't speak but gives me permission to mail him information again about Medicaid clinic at [facility]. Confirmed home address, I will send and included my business cards. I remain available as needed should pt have any additional questions moving forward.   Patient is participating in a Managed Medicaid Plan:  No, self pay only  SDOH Screenings   Food Insecurity: No Food Insecurity (03/04/2023)  Housing: Low Risk  (03/04/2023)  Transportation Needs: No Transportation Needs (03/04/2023)  Utilities: Not At Risk (03/04/2023)  Depression (PHQ2-9): Low Risk  (03/04/2022)  Financial Resource Strain: Low Risk  (03/04/2023)  Tobacco Use: High Risk (03/04/2023)   Octavio Graves, MSW, LCSW Clinical Social Worker II [facility] Heart/Vascular Care Navigation  [phone]- work cell phone (preferred) [phone]- desk phone

## 2023-04-13 ENCOUNTER — Telehealth: Payer: Self-pay | Admitting: Licensed Clinical Social Worker

## 2023-04-13 NOTE — Telephone Encounter (Signed)
H&V Care Navigation CSW Progress Note  Clinical Social Worker  received a call from pt without any voicemail left .I returned call to [phone], pt initially answered then hung up, when I called again I left voicemail requesting return call if any additional questions. I remain available as needed moving forward, have mailed additional Medicaid flyer and instructions at this time.   Patient is participating in a Managed Medicaid Plan:  No, self pay only  SDOH Screenings   Food Insecurity: No Food Insecurity (03/04/2023)  Housing: Low Risk  (03/04/2023)  Transportation Needs: No Transportation Needs (03/04/2023)  Utilities: Not At Risk (03/04/2023)  Depression (PHQ2-9): Low Risk  (03/04/2022)  Financial Resource Strain: Low Risk  (03/04/2023)  Tobacco Use: High Risk (03/04/2023)    Octavio Graves, MSW, LCSW Clinical Social Worker II [facility] Heart/Vascular Care Navigation  [phone]- work cell phone (preferred) [phone]- desk phone

## 2023-04-23 ENCOUNTER — Other Ambulatory Visit: Payer: Self-pay

## 2023-04-26 ENCOUNTER — Encounter (HOSPITAL_COMMUNITY): Payer: Self-pay

## 2023-04-26 ENCOUNTER — Ambulatory Visit (HOSPITAL_COMMUNITY)
Admission: EM | Admit: 2023-04-26 | Discharge: 2023-04-26 | Disposition: A | Discharge status: Home / Self Care | Payer: BC Managed Care – PPO | Attending: Emergency Medicine | Admitting: Emergency Medicine

## 2023-04-26 DIAGNOSIS — M545 Low back pain, unspecified: Secondary | ICD-10-CM

## 2023-04-26 MED ORDER — METHOCARBAMOL 500 MG PO TABS
500.0000 mg | ORAL_TABLET | Freq: Two times a day (BID) | ORAL | 0 refills | Status: DC | PRN
  Filled 2023-04-26: qty 20, 10d supply, fill #0

## 2023-04-26 MED ORDER — DEXAMETHASONE SODIUM PHOSPHATE 10 MG/ML IJ SOLN
10.0000 mg | Freq: Once | INTRAMUSCULAR | Status: AC
  Administered 2023-04-26: 10 mg via INTRAMUSCULAR

## 2023-04-26 MED ORDER — PREDNISONE 10 MG PO TABS
ORAL_TABLET | ORAL | 0 refills | Status: AC
  Filled 2023-04-26: qty 10, 7d supply, fill #0

## 2023-04-26 MED ORDER — DEXAMETHASONE SODIUM PHOSPHATE 10 MG/ML IJ SOLN
INTRAMUSCULAR | Status: AC
  Filled 2023-04-26: qty 1

## 2023-04-26 NOTE — Discharge Instructions (Addendum)
You received a steroid shot in clinic today which will hopefully start giving you some relief.  Please start taking the prednisone to help with pain and inflammation.  You will take 2 tablets daily for 3 days and then take 1 tablet daily for 4 days. I have also sent your prescription for Robaxin to take as needed which is a muscle relaxer and can make you drowsy. You can also take Tylenol for breakthrough pain. You can also alternate heat and ice to the area for additional relief. Please follow-up with PCP is pain persists. Please seek immediate medical treatment if you develop fever, worsening pain, painful urination, blood in urine, and increased weakness.

## 2023-04-26 NOTE — ED Provider Notes (Signed)
[facility]    CSN: [id number] Arrival date & time: 04/26/23  0847      History   Chief Complaint Chief Complaint  Patient presents with   Back Pain    HPI Nicholas Espinoza is a 62 y.o. male.   Patient presents with one week of bilateral low back pain. Rates pain 10 out of 10 even with sitting. Denies injury to back. Denies difficulty urinating, painful urination, and blood in urine. Endorses bilateral leg weakness upon standing due to increased pain. Denies numbness, tingling, and radiation of pain. Denies hx of diabetes. Reports hx of low back pain about 2 years ago and was treated with steroids and Robaxin with significant relief. Reports taking OTC Tylenol with minimal relief.    Back Pain Associated symptoms: weakness   Associated symptoms: no abdominal pain, no chest pain, no dysuria, no fever and no numbness     Past Medical History:  Diagnosis Date   Hyperlipidemia    Left ventricular diastolic dysfunction, NYHA class 1     Patient Active Problem List   Diagnosis Date Noted   Thrombocytopenia (HCC) 03/04/2022   Acute right-sided low back pain with right-sided sciatica 12/06/2020   Peripheral vascular disease, unspecified (HCC) 07/30/2020   Palpitations 04/04/2020   Abnormal ECG 04/04/2020    Past Surgical History:  Procedure Laterality Date   ABDOMINAL AORTOGRAM W/LOWER EXTREMITY Bilateral 09/02/2020   Procedure: ABDOMINAL AORTOGRAM W/LOWER EXTREMITY;  Surgeon: Runell Gess, MD;  Location: [facility];  Service: Cardiovascular;  Laterality: Bilateral;   DENTAL SURGERY     PERIPHERAL VASCULAR ATHERECTOMY Left 09/02/2020   Procedure: PERIPHERAL VASCULAR ATHERECTOMY;  Surgeon: Runell Gess, MD;  Location: [facility];  Service: Cardiovascular;  Laterality: Left;   PERIPHERAL VASCULAR INTERVENTION Left 09/02/2020   Procedure: PERIPHERAL VASCULAR INTERVENTION;  Surgeon: Runell Gess, MD;  Location: [facility];  Service:  Cardiovascular;  Laterality: Left;       Home Medications    Prior to Admission medications   Medication Sig Start Date End Date Taking? Authorizing Provider  methocarbamol (ROBAXIN) 500 MG tablet Take 1 tablet (500 mg total) by mouth 2 (two) times daily as needed for muscle spasms. 04/26/23  Yes Susann Givens, Teryn Boerema A, NP  predniSONE (DELTASONE) 10 MG tablet Take 2 tablets (20 mg total) by mouth daily for 3 days, THEN 1 tablet (10 mg total) daily for 4 days. 04/26/23 05/03/23 Yes Wynonia Lawman A, NP  aspirin EC 81 MG tablet Take 81 mg by mouth daily. Swallow whole.    [provider]  cilostazol (PLETAL) 100 MG tablet Take 1 tablet (100 mg total) by mouth 2 (two) times daily. 02/26/23   Wallis Bamberg, PA-C  clopidogrel (PLAVIX) 75 MG tablet Take 1 tablet (75 mg total) by mouth daily with breakfast. 09/03/22   Runell Gess, MD  gabapentin (NEURONTIN) 300 MG capsule TAKE 1 CAPSULE (300 MG TOTAL) BY MOUTH 3 (THREE) TIMES DAILY. 03/04/22 03/04/23  Claiborne Rigg, NP  polyethylene glycol-electrolytes (NULYTELY) 420 g solution USE AS DIRECTED Patient not taking: Reported on 03/04/2023 02/01/23   Jenel Lucks, MD  rosuvastatin (CRESTOR) 40 MG tablet Take 1 tablet (40 mg total) by mouth daily. 03/16/23 06/15/23  O'NealRonnald Ramp, MD  albuterol (PROVENTIL HFA;VENTOLIN HFA) 108 (90 Base) MCG/ACT inhaler Inhale 2 puffs into the lungs every 6 (six) hours as needed for wheezing or shortness of breath. 07/11/16 04/08/20  Lorre Nick, MD  Family History Family History  Problem Relation Age of Onset   Heart attack Mother    Cancer - Other Father    Diabetes Sister    Diabetes Sister    Colon cancer Brother    Heart attack Brother 35   Esophageal cancer Neg Hx    Rectal cancer Neg Hx    Stomach cancer Neg Hx     Social History Social History   Tobacco Use   Smoking status: Some Days    Current packs/day: 0.25    Average packs/day: 0.3 packs/day for 0.5 years (0.1 ttl pk-yrs)     Types: Cigarettes   Smokeless tobacco: Never   Tobacco comments:    less then 1 cigarette daily  Vaping Use   Vaping status: Never Used  Substance Use Topics   Alcohol use: Yes    Comment: occ   Drug use: Yes    Types: Marijuana     Allergies   Patient has no known allergies.   Review of Systems Review of Systems  Constitutional:  Positive for activity change. Negative for appetite change, fatigue and fever.  Respiratory:  Negative for chest tightness and shortness of breath.   Cardiovascular:  Negative for chest pain and leg swelling.  Gastrointestinal:  Negative for abdominal pain, nausea and vomiting.  Genitourinary:  Negative for decreased urine volume, difficulty urinating, dysuria, flank pain, frequency and hematuria.  Musculoskeletal:  Positive for back pain. Negative for gait problem, joint swelling, neck pain and neck stiffness.  Neurological:  Positive for weakness. Negative for dizziness and numbness.     Physical Exam Triage Vital Signs ED Triage Vitals  Encounter Vitals Group     BP 04/26/23 0857 113/79     Systolic BP Percentile --      Diastolic BP Percentile --      Pulse Rate 04/26/23 0857 70     Resp 04/26/23 0857 16     Temp 04/26/23 0857 98.4 F (36.9 C)     Temp Source 04/26/23 0857 Oral     SpO2 04/26/23 0857 98 %     Weight 04/26/23 0856 135 lb (61.2 kg)     Height 04/26/23 0856 5\' 10"  (1.778 m)     Head Circumference --      Peak Flow --      Pain Score 04/26/23 0856 10     Pain Loc --      Pain Education --      Exclude from Growth Chart --    No data found.  Updated Vital Signs BP 113/79 (BP Location: Left Arm)   Pulse 70   Temp 98.4 F (36.9 C) (Oral)   Resp 16   Ht 5\' 10"  (1.778 m)   Wt 135 lb (61.2 kg)   SpO2 98%   BMI 19.37 kg/m   Visual Acuity Right Eye Distance:   Left Eye Distance:   Bilateral Distance:    Right Eye Near:   Left Eye Near:    Bilateral Near:     Physical Exam Constitutional:      General:  He is awake. He is not in acute distress.    Appearance: Normal appearance. He is well-developed. He is not toxic-appearing.  Cardiovascular:     Rate and Rhythm: Normal rate.     Heart sounds: Normal heart sounds.  Pulmonary:     Effort: Pulmonary effort is normal. No respiratory distress.     Breath sounds: Normal breath sounds.     Comments:  Clear on auscultation bilaterally Abdominal:     Tenderness: There is no abdominal tenderness. There is no right CVA tenderness or left CVA tenderness.  Musculoskeletal:     Cervical back: Normal and normal range of motion. No tenderness or bony tenderness.     Thoracic back: Normal. No tenderness or bony tenderness.     Lumbar back: Tenderness present. No swelling or bony tenderness.  Skin:    General: Skin is warm and dry.  Neurological:     Mental Status: He is alert and oriented to person, place, and time.  Psychiatric:        Behavior: Behavior is cooperative.      UC Treatments / Results  Labs (all labs ordered are listed, but only abnormal results are displayed) Labs Reviewed - No data to display  EKG   Radiology No results found.  Procedures Procedures (including critical care time)  Medications Ordered in UC Medications  dexamethasone (DECADRON) injection 10 mg (10 mg Intramuscular Given 04/26/23 0949)    Initial Impression / Assessment and Plan / UC Course  I have reviewed the triage vital signs and the nursing notes.  Pertinent labs & imaging results that were available during my care of the patient were reviewed by me and considered in my medical decision making (see chart for details).     Patient presented with 1 week hx of bilateral low back pain. Denies injury. Denies difficulty urinating, blood in urine, and painful urination. Endorses bilateral leg weakness upon standing due to pain. Denies numbness, tingling, and radiation of pain. Reports hx of low back pain abut 2 years ago and was treated with steroids and  muscle relaxer. Upon assessment patient is tender to palpation of bilateral low back. Patient was able to ambulate from exam chair and back without difficulty. Patient endorses increased pain upon standing and mild leg weakness with walking. CVA tenderness not present. Given Decadron injection in clinic eo help with pain and inflammation. Prescribed short course of prednisone and Robaxin as needed. Discussed taking Tylenol for breakthrough pain and using heat and ice for additional relief. Suggested following up with PCP if pain persists. Recommended seeking immediate medical treatment if develops fever, worsening pain, increased weakness, difficulty urinating, blood in urine, and/or painful urination.  Final Clinical Impressions(s) / UC Diagnoses   Final diagnoses:  Acute bilateral low back pain without sciatica     Discharge Instructions      You received a steroid shot in clinic today which will hopefully start giving you some relief.  Please start taking the prednisone to help with pain and inflammation.  You will take 2 tablets daily for 3 days and then take 1 tablet daily for 4 days. I have also sent your prescription for Robaxin to take as needed which is a muscle relaxer and can make you drowsy. You can also take Tylenol for breakthrough pain. You can also alternate heat and ice to the area for additional relief. Please follow-up with PCP is pain persists. Please seek immediate medical treatment if you develop fever, worsening pain, painful urination, blood in urine, and increased weakness.    ED Prescriptions     Medication Sig Dispense Auth. Provider   methocarbamol (ROBAXIN) 500 MG tablet Take 1 tablet (500 mg total) by mouth 2 (two) times daily as needed for muscle spasms. 20 tablet Wynonia Lawman A, NP   predniSONE (DELTASONE) 10 MG tablet Take 2 tablets (20 mg total) by mouth daily for 3 days, THEN 1  tablet (10 mg total) daily for 4 days. 10 tablet Wynonia Lawman A, NP       PDMP not reviewed this encounter.   Wynonia Lawman A, NP 04/26/23 1007

## 2023-04-26 NOTE — ED Triage Notes (Signed)
Patient here today with c/o LB pain X 1 week. Pain increases when he stands. No known injury.

## 2023-04-27 ENCOUNTER — Other Ambulatory Visit: Payer: Self-pay

## 2023-05-07 ENCOUNTER — Telehealth: Payer: Self-pay | Admitting: Gastroenterology

## 2023-05-07 NOTE — Telephone Encounter (Signed)
The patient has been notified of this information and all questions answered. He has been advised that he will hear back from [place] regarding his blood thinners.

## 2023-05-07 NOTE — Telephone Encounter (Signed)
Patient called would like to speak with a nurse regarding his prep for the hospital procedure scheduled on 05/31/23.

## 2023-05-08 ENCOUNTER — Encounter (HOSPITAL_COMMUNITY): Payer: Self-pay | Admitting: Emergency Medicine

## 2023-05-08 ENCOUNTER — Ambulatory Visit (HOSPITAL_COMMUNITY)
Admission: EM | Admit: 2023-05-08 | Discharge: 2023-05-08 | Disposition: A | Discharge status: Home / Self Care | Payer: BC Managed Care – PPO | Attending: Family Medicine | Admitting: Family Medicine

## 2023-05-08 DIAGNOSIS — M5441 Lumbago with sciatica, right side: Secondary | ICD-10-CM

## 2023-05-08 MED ORDER — METHYLPREDNISOLONE SODIUM SUCC 125 MG IJ SOLR
60.0000 mg | Freq: Once | INTRAMUSCULAR | Status: AC
  Administered 2023-05-08: 60 mg via INTRAMUSCULAR

## 2023-05-08 MED ORDER — OXYCODONE-ACETAMINOPHEN 5-325 MG PO TABS: 1.0000 | ORAL_TABLET | Freq: Three times a day (TID) | ORAL | 0 refills | Status: AC | PRN

## 2023-05-08 MED ORDER — METHYLPREDNISOLONE SODIUM SUCC 125 MG IJ SOLR
INTRAMUSCULAR | Status: AC
  Filled 2023-05-08: qty 2

## 2023-05-08 NOTE — ED Provider Notes (Signed)
[facility]    CSN: [id number] Arrival date & time: 05/08/23  1543      History   Chief Complaint Chief Complaint  Patient presents with   Back Pain    HPI Nicholas Espinoza is a 62 y.o. male.   The history is provided by the patient. No language interpreter was used.  Back Pain Location:  Lumbar spine Quality:  Aching Radiates to:  R thigh and R knee Pain severity now: pain is about 10/10 in severity. Onset quality:  Gradual Duration:  1 month Timing:  Constant Progression:  Worsening Chronicity:  Recurrent (He had a similar episode recently for which he was treated at the UC with a steroid shot. However, he listed a heavy TV recently which aggravated his pain.) Relieved by: Tylenol helps some. He said he is unable to take NSAIDs. Associated symptoms: no fever, no numbness, no tingling and no weakness     Past Medical History:  Diagnosis Date   Hyperlipidemia    Left ventricular diastolic dysfunction, NYHA class 1     Patient Active Problem List   Diagnosis Date Noted   Thrombocytopenia (HCC) 03/04/2022   Acute right-sided low back pain with right-sided sciatica 12/06/2020   Peripheral vascular disease, unspecified (HCC) 07/30/2020   Palpitations 04/04/2020   Abnormal ECG 04/04/2020    Past Surgical History:  Procedure Laterality Date   ABDOMINAL AORTOGRAM W/LOWER EXTREMITY Bilateral 09/02/2020   Procedure: ABDOMINAL AORTOGRAM W/LOWER EXTREMITY;  Surgeon: Runell Gess, MD;  Location: [facility];  Service: Cardiovascular;  Laterality: Bilateral;   DENTAL SURGERY     PERIPHERAL VASCULAR ATHERECTOMY Left 09/02/2020   Procedure: PERIPHERAL VASCULAR ATHERECTOMY;  Surgeon: Runell Gess, MD;  Location: [facility];  Service: Cardiovascular;  Laterality: Left;   PERIPHERAL VASCULAR INTERVENTION Left 09/02/2020   Procedure: PERIPHERAL VASCULAR INTERVENTION;  Surgeon: Runell Gess, MD;  Location: [facility];  Service:  Cardiovascular;  Laterality: Left;       Home Medications    Prior to Admission medications   Medication Sig Start Date End Date Taking? Authorizing Provider  oxyCODONE-acetaminophen (PERCOCET/ROXICET) 5-325 MG tablet Take 1 tablet by mouth every 8 (eight) hours as needed for up to 7 days for severe pain. 05/08/23 05/15/23 Yes Doreene Eland, MD  aspirin EC 81 MG tablet Take 81 mg by mouth daily. Swallow whole.    [provider]  clopidogrel (PLAVIX) 75 MG tablet Take 1 tablet (75 mg total) by mouth daily with breakfast. 09/03/22   Runell Gess, MD  gabapentin (NEURONTIN) 300 MG capsule TAKE 1 CAPSULE (300 MG TOTAL) BY MOUTH 3 (THREE) TIMES DAILY. 03/04/22 03/04/23  Claiborne Rigg, NP  methocarbamol (ROBAXIN) 500 MG tablet Take 1 tablet (500 mg total) by mouth 2 (two) times daily as needed for muscle spasms. 04/26/23   Wynonia Lawman A, NP  rosuvastatin (CRESTOR) 40 MG tablet Take 1 tablet (40 mg total) by mouth daily. 03/16/23 06/15/23  O'NealRonnald Ramp, MD  albuterol (PROVENTIL HFA;VENTOLIN HFA) 108 (90 Base) MCG/ACT inhaler Inhale 2 puffs into the lungs every 6 (six) hours as needed for wheezing or shortness of breath. 07/11/16 04/08/20  Lorre Nick, MD    Family History Family History  Problem Relation Age of Onset   Heart attack Mother    Cancer - Other Father    Diabetes Sister    Diabetes Sister    Colon cancer Brother    Heart attack Brother 30  Esophageal cancer Neg Hx    Rectal cancer Neg Hx    Stomach cancer Neg Hx     Social History Social History   Tobacco Use   Smoking status: Some Days    Current packs/day: 0.25    Average packs/day: 0.3 packs/day for 0.5 years (0.1 ttl pk-yrs)    Types: Cigarettes   Smokeless tobacco: Never   Tobacco comments:    less then 1 cigarette daily  Vaping Use   Vaping status: Never Used  Substance Use Topics   Alcohol use: Yes    Comment: occ   Drug use: Yes    Types: Marijuana     Allergies    Patient has no known allergies.   Review of Systems Review of Systems  Constitutional:  Negative for fever.  Musculoskeletal:  Positive for back pain.  Neurological:  Negative for tingling, weakness and numbness.  All other systems reviewed and are negative.    Physical Exam Triage Vital Signs ED Triage Vitals  Encounter Vitals Group     BP 05/08/23 1631 116/78     Systolic BP Percentile --      Diastolic BP Percentile --      Pulse Rate 05/08/23 1631 71     Resp 05/08/23 1631 17     Temp 05/08/23 1631 98.1 F (36.7 C)     Temp Source 05/08/23 1631 Oral     SpO2 05/08/23 1631 97 %     Weight --      Height --      Head Circumference --      Peak Flow --      Pain Score 05/08/23 1632 10     Pain Loc --      Pain Education --      Exclude from Growth Chart --    No data found.  Updated Vital Signs BP 116/78 (BP Location: Left Arm)   Pulse 71   Temp 98.1 F (36.7 C) (Oral)   Resp 17   SpO2 97%   Visual Acuity Right Eye Distance:   Left Eye Distance:   Bilateral Distance:    Right Eye Near:   Left Eye Near:    Bilateral Near:     Physical Exam Vitals and nursing note reviewed.  Cardiovascular:     Rate and Rhythm: Normal rate and regular rhythm.     Heart sounds: Normal heart sounds.  Pulmonary:     Effort: Pulmonary effort is normal. No respiratory distress.     Breath sounds: Normal breath sounds. No wheezing.  Musculoskeletal:     Cervical back: Normal.     Thoracic back: Normal.     Lumbar back: Tenderness present. Normal range of motion.      UC Treatments / Results  Labs (all labs ordered are listed, but only abnormal results are displayed) Labs Reviewed - No data to display  EKG   Radiology No results found.  Procedures Procedures (including critical care time)  Medications Ordered in UC Medications  methylPREDNISolone sodium succinate (SOLU-MEDROL) 125 mg/2 mL injection 60 mg (has no administration in time range)    Initial  Impression / Assessment and Plan / UC Course  I have reviewed the triage vital signs and the nursing notes.  Pertinent labs & imaging results that were available during my care of the patient were reviewed by me and considered in my medical decision making (see chart for details).  Clinical Course as of 05/08/23 1653  Sat May 08, 2023  1650 Lumbar pain ?? Arthritis vs disc herniation No prior imaging, but will recommend CT spine give nature of his symptoms -worsened with lifting heavy object PCP f/u recommended for imaging and PT referral He requested steroid shot which helped in the past Unfortunately we are out of decadron Solumedrol 60 mg injection given Percocet escribed x 7 days Return precautions discussed He agreed with the plan [KE]    Clinical Course User Index [KE] Doreene Eland, MD    Final Clinical Impressions(s) / UC Diagnoses   Final diagnoses:  Midline low back pain with right-sided sciatica, unspecified chronicity     Discharge Instructions      It was nice seeing you today. I am sorry about your pain. You might have arthritis or disc herniation. We'll know this if you get a CT scan of your spine. Please see PCP soon for imaging and PT referral. Today, we gave you a steroid shot, and I prescribed pain medication to your pharmacy. See [place] soon if symptoms worsen.     ED Prescriptions     Medication Sig Dispense Auth. Provider   oxyCODONE-acetaminophen (PERCOCET/ROXICET) 5-325 MG tablet Take 1 tablet by mouth every 8 (eight) hours as needed for up to 7 days for severe pain. 15 tablet Doreene Eland, MD      I have reviewed the PDMP during this encounter.   Doreene Eland, MD 05/08/23 [phone]

## 2023-05-08 NOTE — ED Notes (Signed)
No answer from lobby

## 2023-05-08 NOTE — ED Triage Notes (Signed)
Pt was seen here beginning of the month for lower back pains. Pt reports that helped lift a tv last week and now having lower back pains that radiates down right leg.

## 2023-05-08 NOTE — Discharge Instructions (Addendum)
It was nice seeing you today. I am sorry about your pain. You might have arthritis or disc herniation. We'll know this if you get a CT scan of your spine. Please see PCP soon for imaging and PT referral. Today, we gave you a steroid shot, and I prescribed pain medication to your pharmacy. See [place] soon if symptoms worsen.

## 2023-05-14 ENCOUNTER — Ambulatory Visit: Payer: Self-pay | Admitting: *Deleted

## 2023-05-14 ENCOUNTER — Ambulatory Visit: Payer: Self-pay | Admitting: Family Medicine

## 2023-05-14 NOTE — Telephone Encounter (Signed)
Patient given appt for Saturday clinic  on 05/15/2023 at 0920. Patient verbalized understanding.

## 2023-05-14 NOTE — Telephone Encounter (Signed)
  Chief Complaint: back pain worsening.  Symptoms: middle back pain right side radiates down right leg/N/T. Constant already taking oxycodone, muscle relaxers, tylenol with minimal relief.   Frequency: a few months Pertinent Negatives: Patient denies abdominal pain no issues with B/B.  Disposition: [] ED /[] Urgent Care (no appt availability in office) / [] Appointment(In office/virtual)/ []  [facility]/ [] Home Care/ [x] Refused Recommended Disposition /[] [facility]/ []  Follow-up with PCP Additional Notes:   Recommended UC/ ED due to no available appt with PCP or practice. Patient reports he has been to ED and they continue to refer him back to PCP. Earliest available appt scheduled for 06/03/23. Patient was previously scheduled for today at [facility] and is not a patient at that practice. Please advise if patient can be seen and referral placed for severe back pain.         Reason for Disposition  [1] SEVERE back pain (e.g., excruciating, unable to do any normal activities) AND [2] not improved 2 hours after pain medicine  Answer Assessment - Initial Assessment Questions 1. ONSET: "When did the pain begin?"      Few months of ago  2. LOCATION: "Where does it hurt?" (upper, mid or lower back)     Middle right back  3. SEVERITY: "How bad is the pain?"  (e.g., Scale 1-10; mild, moderate, or severe)   - MILD (1-3): Doesn't interfere with normal activities.    - MODERATE (4-7): Interferes with normal activities or awakens from sleep.    - SEVERE (8-10): Excruciating pain, unable to do any normal activities.      Pain level 8 4. PATTERN: "Is the pain constant?" (e.g., yes, no; constant, intermittent)      Constant  5. RADIATION: "Does the pain shoot into your legs or somewhere else?"     Shoots down right leg 6. CAUSE:  "What do you think is causing the back pain?"      Not sure  7. BACK OVERUSE:  "Any recent lifting of heavy objects, strenuous work or exercise?"     Na   8. MEDICINES: "What have you taken so far for the pain?" (e.g., nothing, acetaminophen, NSAIDS)     Oxycodone , muscle relaxers tylenol 9. NEUROLOGIC SYMPTOMS: "Do you have any weakness, numbness, or problems with bowel/bladder control?"     N/T down right leg 10. OTHER SYMPTOMS: "Do you have any other symptoms?" (e.g., fever, abdomen pain, burning with urination, blood in urine)       Middle back right side. Radiates down right leg  11. PREGNANCY: "Is there any chance you are pregnant?" "When was your last menstrual period?"       na  Protocols used: Back Pain-A-AH

## 2023-05-15 ENCOUNTER — Encounter: Payer: Self-pay | Admitting: Family

## 2023-05-15 ENCOUNTER — Ambulatory Visit: Payer: BC Managed Care – PPO | Attending: Family | Admitting: Family

## 2023-05-15 DIAGNOSIS — M5441 Lumbago with sciatica, right side: Secondary | ICD-10-CM

## 2023-05-15 MED ORDER — ACETAMINOPHEN 325 MG PO TABS
325.0000 mg | ORAL_TABLET | Freq: Four times a day (QID) | ORAL | 0 refills | Status: AC | PRN
Start: 2023-05-15 — End: 2023-06-14

## 2023-05-15 MED ORDER — NAPROXEN 375 MG PO TABS
375.0000 mg | ORAL_TABLET | Freq: Two times a day (BID) | ORAL | 0 refills | Status: DC
Start: 2023-05-15 — End: 2023-05-15

## 2023-05-15 MED ORDER — GABAPENTIN 300 MG PO CAPS: ORAL_CAPSULE | Freq: Three times a day (TID) | ORAL | 3 refills | Status: DC

## 2023-05-15 NOTE — Progress Notes (Unsigned)
Nicholas Espinoza, is a 62 y.o. male  MWN:[id number]  QIH:[id number]  DOB - 1961-03-10  Subjective:  Chief Complaint: Leg pain  HPI: Nicholas Espinoza is a 62 y.o. male here today complaining about pain his lower back that radiates to his right lower extremity.  Patient reports he has been dealing with this issue for many months now.  On May 08, 2023 patient was seen at a local emergency room for back pain, was referred to primary care for imaging and physical therapy referrals. Patient was also prescribed some Percocet which did not with symptoms, per patient. Patient works full-time, he is on his feet all day and the pain is worse when he sits down.  Reports no injury to his back or lower extremities in the past.  Reports no incontinence with this new condition. Reports no chest pain, shortness of breath or fever.   ED/Hospital notes reviewed.   Social History: Reviewed Family history: Reviewed  ROS:   Constitutional:  No f/c, No night sweats, No unexplained weight loss. EENT:  No vision changes, No blurry vision, No hearing changes. No mouth, throat, or ear problems.  Respiratory: No cough, No SOB Cardiac: No CP, no palpitations GI:  No abd pain, No N/V/D. GU: No Urinary s/sx Musculoskeletal: Reports pain to lower back that radiates to right lower extremity.  Neuro: No headache, no dizziness, no motor weakness.  Skin: No rash Endocrine:  No polydipsia. No polyuria.  Psych: Denies SI/HI  No problems updated.  ALLERGIES: No Known Allergies  PAST MEDICAL HISTORY: Past Medical History:  Diagnosis Date   Hyperlipidemia    Left ventricular diastolic dysfunction, NYHA class 1     MEDICATIONS AT HOME: Prior to Admission medications   Medication Sig Start Date End Date Taking? Authorizing Provider  clopidogrel (PLAVIX) 75 MG tablet Take 1 tablet (75 mg total) by mouth daily with breakfast. 09/03/22  Yes Runell Gess, MD  methocarbamol (ROBAXIN) 500 MG tablet Take 1 tablet  (500 mg total) by mouth 2 (two) times daily as needed for muscle spasms. 04/26/23  Yes Susann Givens, Carley A, NP  rosuvastatin (CRESTOR) 40 MG tablet Take 1 tablet (40 mg total) by mouth daily. 03/16/23 06/15/23 Yes O'Neal, Ronnald Ramp, MD  aspirin EC 81 MG tablet Take 81 mg by mouth daily. Swallow whole.    [provider]  gabapentin (NEURONTIN) 300 MG capsule TAKE 1 CAPSULE (300 MG TOTAL) BY MOUTH 3 (THREE) TIMES DAILY. 03/04/22 03/04/23  Claiborne Rigg, NP  oxyCODONE-acetaminophen (PERCOCET/ROXICET) 5-325 MG tablet Take 1 tablet by mouth every 8 (eight) hours as needed for up to 7 days for severe pain. Patient not taking: Reported on 05/15/2023 05/08/23 05/15/23  Doreene Eland, MD  albuterol (PROVENTIL HFA;VENTOLIN HFA) 108 (90 Base) MCG/ACT inhaler Inhale 2 puffs into the lungs every 6 (six) hours as needed for wheezing or shortness of breath. 07/11/16 04/08/20  Lorre Nick, MD     Objective:  EXAM:   Vitals:   05/15/23 0924  BP: 125/84  Pulse: 81  SpO2: 95%  Weight: 125 lb 9.6 oz (57 kg)    General appearance : A&OX3. NAD. Non-toxic-appearing HEENT: Atraumatic and Normocephalic.  PERRLA. EOM intact.  TM clear B. Mouth-MMM, post pharynx WNL w/o erythema, No PND. Neck: supple, no JVD. No cervical lymphadenopathy. No thyromegaly Chest/Lungs:  Breathing-non-labored, Good air entry bilaterally, breath sounds normal without rales, rhonchi, or wheezing  CVS: S1 S2 regular, no murmurs, gallops, rubs  Abdomen: Bowel sounds present, Non tender  and not distended with no gaurding, rigidity or rebound. Musculoskeletal: Pain with palpation to lower back and right buttock appreciated. Flexion and extension of hips limited due to pain. Extremities: Bilateral Lower Ext shows no edema, both legs are warm to touch with = pulse throughout Neurology:  CN II-XII grossly intact, Non focal.   Psych:  TP linear. J/I WNL. Normal speech. Appropriate eye contact and affect.  Skin:  No Rash  Data  Review Lab Results  Component Value Date   HGBA1C 5.4 01/24/2020     Assessment & Plan  Patient is a 62 year old male who is seen for lower back pain that radiates to his right lower extremity.  His pain is worse when he sits and relieved by activities.  Has sciatica pain and will benefit from anti-inflammatory medications and referral to physical therapy.  Since he is taking Plavix he is not a good candidate for naproxen or ibuprofen.  He will benefit from acetaminophen as needed coupled with his prescribed gabapentin before he is seen and evaluated by physical therapy.  1. Acute right-sided low back pain with right-sided sciatica -Take Tylenol 325 mg Q6HR as needed for pain, scription sent to listed pharmacy - Gabapentin refilled at current doses and frequencies (300 mg PO TID) - Referral to physical therapy initiated. -Reminded to report new or worsening symptoms to the clinic or local emergency room.  Verbalized understanding. -Patient to follow-up with primary care provider in 90 days.  Patient have been counseled extensively about nutrition and exercise  Return in about 3 months (around 08/14/2023).  The patient was given clear instructions to go to ER or return to medical center if symptoms don't improve, worsen or new problems develop. The patient verbalized understanding. The patient was told to call to get lab results if they haven't heard anything in the next week.     [name], DNP, APRN, FNP-C [facility] [place], [place] [phone]   05/15/2023, 10:09 AM

## 2023-05-20 NOTE — Telephone Encounter (Signed)
  The pt has been advised to stop the plavix 5 days prior to his procedure.  The pt has been advised of the information and verbalized understanding.      His Plavix may be held for 5 days prior to his procedure.  Please resume as soon as hemostasis is achieved.  Patient reports that he is no longer taking Pletal.   I will route this recommendation to the requesting party via Epic fax function and remove from pre-op pool.   Please call with questions.   Thomasene Ripple. Cleaver NP-C

## 2023-05-20 NOTE — Telephone Encounter (Signed)
Inbound call from patient in regards to previous note. Please advise.

## 2023-05-24 ENCOUNTER — Encounter (HOSPITAL_COMMUNITY): Payer: Self-pay | Admitting: Gastroenterology

## 2023-05-24 ENCOUNTER — Telehealth: Payer: Self-pay | Admitting: Gastroenterology

## 2023-05-24 NOTE — Telephone Encounter (Signed)
Left message on machine to call back  

## 2023-05-24 NOTE — Telephone Encounter (Signed)
Inbound call from patient stating he has a transportation conflict for 10/7 procedure. Patient states he needs to reschedule for a Friday. Patient requesting a call back. States it is okay to leave appointment on his voicemail. Please advise, thank you.

## 2023-05-25 NOTE — Telephone Encounter (Signed)
I spoke with the pt and he has decided to keep the appt for 10/7 as planned.  He will call if he has any further concerns

## 2023-05-28 NOTE — Telephone Encounter (Signed)
The pt has no caregiver for 10/7 procedure. He has asked to be rescheduled. Appt moved to 08/09/23 at 10 am. He has been re instructed and new instructions mailed

## 2023-05-28 NOTE — Telephone Encounter (Signed)
Inbound call from patient, requesting to reschedule his procedure. Patient stated he wished to speak with a nurse as to the reasons why.

## 2023-06-01 ENCOUNTER — Other Ambulatory Visit: Payer: Self-pay

## 2023-06-03 ENCOUNTER — Ambulatory Visit: Payer: Self-pay | Admitting: Physician Assistant

## 2023-06-03 ENCOUNTER — Ambulatory Visit: Payer: BC Managed Care – PPO | Admitting: Physical Therapy

## 2023-06-08 NOTE — Therapy (Addendum)
 OUTPATIENT PHYSICAL THERAPY EVALUATION  DISCHARGE   Patient Name: Nicholas Espinoza MRN: [id number] DOB:05-24-1961, 62 y.o., male Today's Date: 06/09/2023   END OF SESSION:  PT End of Session - 06/09/23 1110     Visit Number 1    Number of Visits 4    Date for PT Re-Evaluation 07/07/23    Authorization Type BCBS    PT Start Time 1100    PT Stop Time 1140    PT Time Calculation (min) 40 min    Activity Tolerance Patient tolerated treatment well    Behavior During Therapy [facility] for tasks assessed/performed             Past Medical History:  Diagnosis Date   Hyperlipidemia    Left ventricular diastolic dysfunction, NYHA class 1    Past Surgical History:  Procedure Laterality Date   ABDOMINAL AORTOGRAM W/LOWER EXTREMITY Bilateral 09/02/2020   Procedure: ABDOMINAL AORTOGRAM W/LOWER EXTREMITY;  Surgeon: Runell Gess, MD;  Location: [facility] INVASIVE CV LAB;  Service: Cardiovascular;  Laterality: Bilateral;   DENTAL SURGERY     PERIPHERAL VASCULAR ATHERECTOMY Left 09/02/2020   Procedure: PERIPHERAL VASCULAR ATHERECTOMY;  Surgeon: Runell Gess, MD;  Location: [facility] INVASIVE CV LAB;  Service: Cardiovascular;  Laterality: Left;   PERIPHERAL VASCULAR INTERVENTION Left 09/02/2020   Procedure: PERIPHERAL VASCULAR INTERVENTION;  Surgeon: Runell Gess, MD;  Location: [facility] INVASIVE CV LAB;  Service: Cardiovascular;  Laterality: Left;   Patient Active Problem List   Diagnosis Date Noted   Thrombocytopenia (HCC) 03/04/2022   Acute right-sided low back pain with right-sided sciatica 12/06/2020   Peripheral vascular disease, unspecified (HCC) 07/30/2020   Palpitations 04/04/2020   Abnormal ECG 04/04/2020    PCP: Claiborne Rigg, NP  REFERRING PROVIDER: [name], FNP  REFERRING DIAG: Acute right-sided low back pain with right-sided sciatica  Rationale for Evaluation and Treatment: Rehabilitation  THERAPY DIAG:  Other low back pain  Muscle weakness  (generalized)  ONSET DATE: Chronic   SUBJECTIVE:                                                                                                                                                                                          SUBJECTIVE STATEMENT: Patient reports lower back pain in the middle part, and then would go down the back of the right leg. Has been going on for a few months but has eased down a little bit. The lower back pain just happens, sometimes with cool or rainy weather, and the right leg pain happens whenever his back is bothering him. States that if he is aching then it will bother him  with standing, walking, or sitting, but if he is not aching then it doesn't bother him too much.  PERTINENT HISTORY:  See PMH above  PAIN:  Are you having pain? Yes:  NPRS scale: 0/10 Pain location: Lower back and right leg Pain description: Aching Aggravating factors: Cool or rainy weather Relieving factors: Medication  PRECAUTIONS: None  RED FLAGS: None   WEIGHT BEARING RESTRICTIONS: No  FALLS:  Has patient fallen in last 6 months? No  OCCUPATION: Housekeeping  PLOF: Independent  PATIENT GOALS: Keep pain from coming back   OBJECTIVE:  Note: Objective measures were completed at Evaluation unless otherwise noted. PATIENT SURVEYS:  FOTO 83% % functional status  SCREENING FOR RED FLAGS: Negative  COGNITION: Overall cognitive status: Within functional limits for tasks assessed     SENSATION: Appears intact  MUSCLE LENGTH: Tightness bilateral hamstrings and quads  POSTURE:   Decreased lumbar lordosis, rounded shoulders  PALPATION: Non tender to palpation, hypomobility of the lumbar spine with CPAs  LUMBAR ROM:   AROM eval  Flexion WFL  Extension 50%  Right lateral flexion WFL  Left lateral flexion WFL  Right rotation 75%  Left rotation 75%   (Blank rows = not tested)  LOWER EXTREMITY ROM:      LE ROM grossly WFL  LOWER EXTREMITY MMT:    MMT  Right eval Left eval  Hip flexion 4 4  Hip extension 3+ 3+  Hip abduction 4- 4-  Hip adduction    Hip internal rotation    Hip external rotation    Knee flexion 5 5  Knee extension 5 5  Ankle dorsiflexion    Ankle plantarflexion    Ankle inversion    Ankle eversion     (Blank rows = not tested)  LUMBAR SPECIAL TESTS:  Radicular testing negative  FUNCTIONAL TESTS:  Not assessed  GAIT: Assistive device utilized: None Level of assistance: Complete Independence Comments: WFL   TODAY'S TREATMENT:    [facility] Adult PT Treatment:                                                DATE: 06/09/2023 Therapeutic Exercise: LTR x 10 Bridge x 10 Side clamshell with red x 10 each Seated hamstring stretch 2 x 20 sec each  PATIENT EDUCATION:  Education details: Exam findings, POC, HEP Person educated: Patient Education method: Explanation, Demonstration, Tactile cues, Verbal cues, and Handouts Education comprehension: verbalized understanding, returned demonstration, verbal cues required, tactile cues required, and needs further education  HOME EXERCISE PROGRAM: Access Code: [email or web address]    ASSESSMENT: CLINICAL IMPRESSION: Patient is a 62 y.o. male who was seen today for physical therapy evaluation and treatment for chronic lower back and right leg pain. He is currently reporting no pain regarding his lower back or right leg. He does demonstrate limitations with lumbar mobility and flexibility deficits noted above. He also exhibits strength deficits of the core and hips noted above.    OBJECTIVE IMPAIRMENTS: decreased activity tolerance, decreased ROM, decreased strength, impaired flexibility, postural dysfunction, and pain.   ACTIVITY LIMITATIONS: carrying, lifting, bending, standing, and locomotion level  PARTICIPATION LIMITATIONS: cleaning, community activity, and occupation  PERSONAL FACTORS: Fitness and Time since onset of injury/illness/exacerbation are also affecting patient's  functional outcome.   REHAB POTENTIAL: Good  CLINICAL DECISION MAKING: Stable/uncomplicated  EVALUATION COMPLEXITY: Low   GOALS: Goals reviewed with  patient? Yes  SHORT TERM GOALS: Target date: = LTG  LONG TERM GOALS: Target date: 07/07/2023  Patient will be I with final HEP to maintain progress from PT. Baseline: HEP provided at eval Goal status: INITIAL  2.  Patient will report and increase in FOTO score compared to initial assessment in order to indicate improvement in functional level Baseline: 83% functional status Goal status: INITIAL  3.  Patient will demonstrate hip strength >/= 4/5 MMT in order to improve activity tolerance Baseline: see limitations above Goal status: INITIAL  4.  Patient will demonstrate a 25% improvement in ranges of lumbar motion that are currently limited in order to reduce tightness with work related activities Baseline: see limitations above Goal status: INITIAL   PLAN: PT FREQUENCY: 1x/week  PT DURATION: 4 weeks  PLANNED INTERVENTIONS: 97164- PT Re-evaluation, 97110-Therapeutic exercises, 97530- Therapeutic activity, 97112- Neuromuscular re-education, 97535- Self Care, 16109- Manual therapy, 97014- Electrical stimulation (unattended), [id number]- Electrical stimulation (manual), Patient/Family education, Dry Needling, Joint mobilization, Joint manipulation, Spinal manipulation, Spinal mobilization, Cryotherapy, and Moist heat.  PLAN FOR NEXT SESSION: Review HEP and progress PRN, manual/mobs for lumbar region, continue with flexibility and strengthening for the core and hips, posture and lifting mechanics   Rosana Hoes, PT, DPT, LAT, ATC 06/09/23  11:44 AM Phone: [phone] Fax: [phone]   PHYSICAL THERAPY DISCHARGE SUMMARY  Visits from Start of Care: 1  Current functional level related to goals / functional outcomes: See above   Remaining deficits: See above   Education / Equipment: HEP   Patient agrees to discharge.  Patient goals were not met. Patient is being discharged due to not returning since the last visit.  Rosana Hoes, PT, DPT, LAT, ATC 11/03/23  3:37 PM Phone: [phone] Fax: [phone]

## 2023-06-09 ENCOUNTER — Other Ambulatory Visit: Payer: Self-pay

## 2023-06-09 ENCOUNTER — Ambulatory Visit: Payer: BC Managed Care – PPO | Attending: Family | Admitting: Physical Therapy

## 2023-06-09 ENCOUNTER — Other Ambulatory Visit: Payer: Self-pay | Admitting: Family

## 2023-06-09 ENCOUNTER — Other Ambulatory Visit: Payer: Self-pay | Admitting: Nurse Practitioner

## 2023-06-09 ENCOUNTER — Encounter: Payer: Self-pay | Admitting: Physical Therapy

## 2023-06-09 DIAGNOSIS — M5441 Lumbago with sciatica, right side: Secondary | ICD-10-CM

## 2023-06-09 DIAGNOSIS — M5459 Other low back pain: Secondary | ICD-10-CM | POA: Insufficient documentation

## 2023-06-09 DIAGNOSIS — M6281 Muscle weakness (generalized): Secondary | ICD-10-CM | POA: Insufficient documentation

## 2023-06-09 NOTE — Patient Instructions (Signed)
Access Code: [email or web address] URL: [email or web address] Date: 06/09/2023 Prepared by: Rosana Hoes  Exercises - Supine Lower Trunk Rotation  - 1 x daily - 10 reps - 5 seconds hold - Bridge  - 1 x daily - 2 sets - 10 reps - Clam with Resistance  - 1 x daily - 2 sets - 10 reps - Seated Hamstring Stretch  - 1 x daily - 3 reps - 20 seconds hold

## 2023-06-09 NOTE — Telephone Encounter (Signed)
Medication Refill - Medication: naproxen (NAPROSYN) 375 MG tablet [[id number]]   Has the patient contacted their pharmacy? Yes.    (Agent: If yes, when and what did the pharmacy advise?) Contact PCP   Preferred Pharmacy (with phone number or street name): [id number] - [place] ([place]), [place] - [address]   Has the patient been seen for an appointment in the last year OR does the patient have an upcoming appointment? Yes.    Agent: Please be advised that RX refills may take up to 3 business days. We ask that you follow-up with your pharmacy.

## 2023-06-09 NOTE — Telephone Encounter (Signed)
Requested Prescriptions  Pending Prescriptions Disp Refills   naproxen (NAPROSYN) 375 MG tablet 30 tablet 0    Sig: Take 1 tablet (375 mg total) by mouth 2 (two) times daily with a meal for 15 days.     Analgesics:  NSAIDS Failed - 06/09/2023 10:28 AM      Failed - Manual Review: Labs are only required if the patient has taken medication for more than 8 weeks.      Failed - HGB in normal range and within 360 days    Hemoglobin  Date Value Ref Range Status  02/09/2023 12.9 (L) 13.0 - 17.7 g/dL Final         Passed - Cr in normal range and within 360 days    Creatinine, Ser  Date Value Ref Range Status  02/26/2023 1.03 0.76 - 1.27 mg/dL Final         Passed - PLT in normal range and within 360 days    Platelets  Date Value Ref Range Status  02/09/2023 154 150 - 450 x10E3/uL Final         Passed - HCT in normal range and within 360 days    Hematocrit  Date Value Ref Range Status  02/09/2023 39.3 37.5 - 51.0 % Final         Passed - eGFR is 30 or above and within 360 days    GFR calc Af Amer  Date Value Ref Range Status  08/12/2020 98 >59 mL/min/1.73 Final    Comment:    **In accordance with recommendations from the NKF-ASN Task force,**   [facility] is in the process of updating its eGFR calculation to the   2021 CKD-EPI creatinine equation that estimates kidney function   without a race variable.    GFR, Estimated  Date Value Ref Range Status  11/24/2022 >60 >60 mL/min Final    Comment:    (NOTE) Calculated using the CKD-EPI Creatinine Equation (2021)    eGFR  Date Value Ref Range Status  02/26/2023 83 >59 mL/min/1.73 Final         Passed - Patient is not pregnant      Passed - Valid encounter within last 12 months    Recent Outpatient Visits           3 weeks ago Acute right-sided low back pain with right-sided sciatica   [facility] [name], FNP   1 year ago Dizziness   [place] [facility] [place], Shea Stakes, NP   1 year ago Dizziness and giddiness   [facility], [place], PA-C   1 year ago Acute bilateral low back pain with sciatica, sciatica laterality unspecified   [facility] Jonah Blue B, MD   2 years ago Acute right-sided low back pain with right-sided sciatica   [facility] Claiborne Rigg, NP       Future Appointments             In 6 days Allyson Sabal, Delton See, MD [facility]   In 2 months Claiborne Rigg, NP [facility]

## 2023-06-10 ENCOUNTER — Other Ambulatory Visit: Payer: Self-pay | Admitting: Family

## 2023-06-10 DIAGNOSIS — M5441 Lumbago with sciatica, right side: Secondary | ICD-10-CM

## 2023-06-11 ENCOUNTER — Other Ambulatory Visit: Payer: Self-pay | Admitting: Family

## 2023-06-11 DIAGNOSIS — M5441 Lumbago with sciatica, right side: Secondary | ICD-10-CM

## 2023-06-13 ENCOUNTER — Other Ambulatory Visit: Payer: Self-pay | Admitting: Family

## 2023-06-13 DIAGNOSIS — M5441 Lumbago with sciatica, right side: Secondary | ICD-10-CM

## 2023-06-14 NOTE — Telephone Encounter (Signed)
Requested by interface surescripts. Medication discontinued 05/15/23. Requested Prescriptions  Refused Prescriptions Disp Refills   naproxen (NAPROSYN) 375 MG tablet [Pharmacy Med Name: Naproxen 375 MG Oral Tablet] 30 tablet 0    Sig: TAKE 1 TABLET BY MOUTH TWICE DAILY WITH A MEAL FOR 15 DAYS     Analgesics:  NSAIDS Failed - 06/13/2023 11:52 AM      Failed - Manual Review: Labs are only required if the patient has taken medication for more than 8 weeks.      Failed - HGB in normal range and within 360 days    Hemoglobin  Date Value Ref Range Status  02/09/2023 12.9 (L) 13.0 - 17.7 g/dL Final         Passed - Cr in normal range and within 360 days    Creatinine, Ser  Date Value Ref Range Status  02/26/2023 1.03 0.76 - 1.27 mg/dL Final         Passed - PLT in normal range and within 360 days    Platelets  Date Value Ref Range Status  02/09/2023 154 150 - 450 x10E3/uL Final         Passed - HCT in normal range and within 360 days    Hematocrit  Date Value Ref Range Status  02/09/2023 39.3 37.5 - 51.0 % Final         Passed - eGFR is 30 or above and within 360 days    GFR calc Af Amer  Date Value Ref Range Status  08/12/2020 98 >59 mL/min/1.73 Final    Comment:    **In accordance with recommendations from the NKF-ASN Task force,**   [facility] is in the process of updating its eGFR calculation to the   2021 CKD-EPI creatinine equation that estimates kidney function   without a race variable.    GFR, Estimated  Date Value Ref Range Status  11/24/2022 >60 >60 mL/min Final    Comment:    (NOTE) Calculated using the CKD-EPI Creatinine Equation (2021)    eGFR  Date Value Ref Range Status  02/26/2023 83 >59 mL/min/1.73 Final         Passed - Patient is not pregnant      Passed - Valid encounter within last 12 months    Recent Outpatient Visits           1 month ago Acute right-sided low back pain with right-sided sciatica   [facility] [name], FNP   1 year ago Dizziness   [facility] [place], Shea Stakes, NP   1 year ago Dizziness and giddiness   [facility], [place], [place]   2 years ago Acute bilateral low back pain with sciatica, sciatica laterality unspecified   [facility] Jonah Blue B, MD   2 years ago Acute right-sided low back pain with right-sided sciatica   [facility] [place], Shea Stakes, NP       Future Appointments             Tomorrow Allyson Sabal, Delton See, MD [facility]   In 2 months Claiborne Rigg, NP [facility]

## 2023-06-15 ENCOUNTER — Ambulatory Visit: Payer: BC Managed Care – PPO | Attending: Cardiovascular Disease | Admitting: Cardiovascular Disease

## 2023-06-15 ENCOUNTER — Encounter: Payer: Self-pay | Admitting: Cardiovascular Disease

## 2023-06-15 VITALS — BP 114/66 | HR 73 | Ht 70.0 in | Wt 133.8 lb

## 2023-06-15 DIAGNOSIS — I739 Peripheral vascular disease, unspecified: Secondary | ICD-10-CM

## 2023-06-15 NOTE — Assessment & Plan Note (Signed)
History of peripheral arterial disease status post orbital atherectomy, PTA and covered stenting using a VBX 8 mm x 59 mm covered stent 09/02/2020 for lifestyle-limiting claudication.  His postprocedure Dopplers normalized and his claudication has resolved.  He continues to deny claudication.  His most recent Doppler studies performed 03/18/2023 revealed normal ABIs bilaterally.  He we will continue to follow his noninvasive studies on an annual basis.

## 2023-06-15 NOTE — Patient Instructions (Addendum)
Medication Instructions:  Your physician recommends that you continue on your current medications as directed. Please refer to the Current Medication list given to you today.  *If you need a refill on your cardiac medications before your next appointment, please call your pharmacy*   Testing/Procedures: Your physician has requested that you have a lower extremity arterial duplex. During this test, ultrasound is used to evaluate arterial blood flow in the legs. Allow one hour for this exam. There are no restrictions or special instructions. This will take place at [address] [facility] [facility], [address]. To do in July 2025.  Your physician has requested that you have an ankle brachial index (ABI). During this test an ultrasound and blood pressure cuff are used to evaluate the arteries that supply the arms and legs with blood. Allow thirty minutes for this exam. There are no restrictions or special instructions. This will take place at [address] [facility], [address]. To do in July 2025.      Follow-Up: At [facility], you and your health needs are our priority.  As part of our continuing mission to provide you with exceptional heart care, we have created designated Provider Care Teams.  These Care Teams include your primary Cardiologist (physician) and Advanced Practice Providers (APPs -  Physician Assistants and Nurse Practitioners) who all work together to provide you with the care you need, when you need it.  We recommend signing up for the patient portal called "MyChart".  Sign up information is provided on this After Visit Summary.  MyChart is used to connect with patients for Virtual Visits (Telemedicine).  Patients are able to view lab/test results, encounter notes, upcoming appointments, etc.  Non-urgent messages can be sent to your provider as well.   To learn more about what you can do with MyChart, go to [email or web address].    Your next appointment:   3-4 month(s)  Provider:    Reatha Harps, MD      Dr. Allyson Sabal will see you on an as needed basis moving forward.

## 2023-06-15 NOTE — Progress Notes (Signed)
06/15/2023 Nicholas Espinoza   20-Oct-1960  [id number]  Primary Physician Nicholas Rigg, NP Primary Cardiologist: Nicholas Gess MD Nicholas Espinoza, [place]  HPI:  Nicholas Espinoza is a 62 y.o.  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 Flora Espinoza, his primary cardiologist, for evaluation of symptomatic PAD.  I last saw him in the office 11/29/2020.  He does have a history of tobacco abuse currently smoking 1 to 2 cigarettes every several weeks down from a pack a week prior to that.  He has no history of diabetes, hypertension or hyperlipidemia.  Apparently his brother did die of a myocardial infarction.  Is never had a heart attack or stroke.  Denies chest pain or shortness of breath.  He said claudication of his left lower extremity for the last 6 months or so with Dopplers that revealed a right ABI of 1.17 and a left of 0.87 with what appears to be a high-frequency signal/hemodynamically significant lesion in the mid left common iliac artery.   I performed peripheral angiography on him 09/02/2020 revealing a highly calcified 90% left common iliac artery stenosis.  I performed orbital atherectomy, PTA and covered stenting using an 8 mm x 59 mm long VBX covered stent.  His postprocedure Doppler studies performed 09/16/2020 revealed normal left ABI with normal velocities.  His left lower extremity claudication has resolved.  He does have some right leg pain which sounds more neurogenic than anything else.  Since I saw him 2 years ago he has remained stable.  He continues to deny claudication.  His most recent Doppler studies performed 03/18/2023 revealed normal ABIs bilaterally.  He denies chest pain or shortness of breath.   Current Meds  Medication Sig   aspirin EC 81 MG tablet Take 81 mg by mouth daily. Swallow whole.   clopidogrel (PLAVIX) 75 MG tablet  Take 1 tablet (75 mg total) by mouth daily with breakfast.   gabapentin (NEURONTIN) 300 MG capsule TAKE 1 CAPSULE (300 MG TOTAL) BY MOUTH 3 (THREE) TIMES DAILY.   rosuvastatin (CRESTOR) 40 MG tablet Take 1 tablet (40 mg total) by mouth daily.   Current Facility-Administered Medications for the 06/15/23 encounter (Office Visit) with Nicholas Gess, MD  Medication   sodium chloride flush (NS) 0.9 % injection 3 mL     No Known Allergies  Social History   Socioeconomic History   Marital status: Single    Spouse name: Not on file   Number of children: 4   Years of education: Not on file   Highest education level: Not on file  Occupational History   Occupation: unemployed  Tobacco Use   Smoking status: Some Days    Current packs/day: 0.25    Average packs/day: 0.3 packs/day for 0.5 years (0.1 ttl pk-yrs)    Types: Cigarettes   Smokeless tobacco: Never   Tobacco comments:    less then 1 cigarette daily  Vaping Use   Vaping status: Never Used  Substance and Sexual Activity   Alcohol use: Yes    Comment: occ   Drug use: Yes    Types: Marijuana   Sexual activity: Yes  Other Topics Concern   Not on file  Social History Narrative   Not on file   Social Determinants of Health   Financial Resource Strain: Low Risk  (  03/04/2023)   Overall Financial Resource Strain (CARDIA)    Difficulty of Paying Living Expenses: Not very hard  Food Insecurity: No Food Insecurity (03/04/2023)   Hunger Vital Sign    Worried About Running Out of Food in the Last Year: Never true    Ran Out of Food in the Last Year: Never true  Transportation Needs: No Transportation Needs (03/04/2023)   PRAPARE - Administrator, Civil Service (Medical): No    Lack of Transportation (Non-Medical): No  Physical Activity: Not on file  Stress: Not on file  Social Connections: Not on file  Intimate Partner Violence: Not on file     Review of Systems: General: negative for chills, fever, night sweats  or weight changes.  Cardiovascular: negative for chest pain, dyspnea on exertion, edema, orthopnea, palpitations, paroxysmal nocturnal dyspnea or shortness of breath Dermatological: negative for rash Respiratory: negative for cough or wheezing Urologic: negative for hematuria Abdominal: negative for nausea, vomiting, diarrhea, bright red blood per rectum, melena, or hematemesis Neurologic: negative for visual changes, syncope, or dizziness All other systems reviewed and are otherwise negative except as noted above.    Blood pressure 114/66, pulse 73, height 5\' 10"  (1.778 m), weight 133 lb 12.8 oz (60.7 kg), SpO2 98%.  General appearance: alert and no distress Neck: no adenopathy, no carotid bruit, no JVD, supple, symmetrical, trachea midline, and thyroid not enlarged, symmetric, no tenderness/mass/nodules Lungs: clear to auscultation bilaterally Heart: regular rate and rhythm, S1, S2 normal, no murmur, click, rub or gallop Extremities: extremities normal, atraumatic, no cyanosis or edema Pulses: 2+ and symmetric Skin: Skin color, texture, turgor normal. No rashes or lesions Neurologic: Grossly normal  EKG not performed today      ASSESSMENT AND PLAN:   Peripheral vascular disease, unspecified (HCC) History of peripheral arterial disease status post orbital atherectomy, PTA and covered stenting using a VBX 8 mm x 59 mm covered stent 09/02/2020 for lifestyle-limiting claudication.  His postprocedure Dopplers normalized and his claudication has resolved.  He continues to deny claudication.  His most recent Doppler studies performed 03/18/2023 revealed normal ABIs bilaterally.  He we will continue to follow his noninvasive studies on an annual basis.     Nicholas Gess MD FACP,FACC,FAHA, [facility] 06/15/2023 8:15 AM

## 2023-06-16 ENCOUNTER — Telehealth: Payer: Self-pay | Admitting: Nurse Practitioner

## 2023-06-16 ENCOUNTER — Other Ambulatory Visit: Payer: Self-pay | Admitting: Family

## 2023-06-16 DIAGNOSIS — M5441 Lumbago with sciatica, right side: Secondary | ICD-10-CM

## 2023-06-16 NOTE — Telephone Encounter (Signed)
Patient called and advised per last OV note on 05/15/23, noted he's not a good candidate for ibuprofen or naproxen due to taking plavix, noted to take tylenol 325 mg Q6H along with the gabapentin. He asked is naproxen is like ibuprofen, explained both are anti-inflammatory and can cause bleeding. He asked for a Rx for tylenol, advised it can be bought OTC. He verbalized understanding.

## 2023-06-16 NOTE — Telephone Encounter (Signed)
Patient called in a separate encounter today and explained that naproxen is not recommended to take due to taking plavix per the last OV note on 05/15/23 and to take Tylenol as per notes. He verbalized understanding. Will refuse this request.   Requested Prescriptions  Pending Prescriptions Disp Refills   naproxen (NAPROSYN) 375 MG tablet [Pharmacy Med Name: Naproxen 375 MG Oral Tablet] 30 tablet 0    Sig: TAKE 1 TABLET BY MOUTH TWICE DAILY WITH A MEAL FOR 15 DAYS     Analgesics:  NSAIDS Failed - 06/16/2023 12:46 PM      Failed - Manual Review: Labs are only required if the patient has taken medication for more than 8 weeks.      Failed - HGB in normal range and within 360 days    Hemoglobin  Date Value Ref Range Status  02/09/2023 12.9 (L) 13.0 - 17.7 g/dL Final         Passed - Cr in normal range and within 360 days    Creatinine, Ser  Date Value Ref Range Status  02/26/2023 1.03 0.76 - 1.27 mg/dL Final         Passed - PLT in normal range and within 360 days    Platelets  Date Value Ref Range Status  02/09/2023 154 150 - 450 x10E3/uL Final         Passed - HCT in normal range and within 360 days    Hematocrit  Date Value Ref Range Status  02/09/2023 39.3 37.5 - 51.0 % Final         Passed - eGFR is 30 or above and within 360 days    GFR calc Af Amer  Date Value Ref Range Status  08/12/2020 98 >59 mL/min/1.73 Final    Comment:    **In accordance with recommendations from the NKF-ASN Task force,**   [facility] is in the process of updating its eGFR calculation to the   2021 CKD-EPI creatinine equation that estimates kidney function   without a race variable.    GFR, Estimated  Date Value Ref Range Status  11/24/2022 >60 >60 mL/min Final    Comment:    (NOTE) Calculated using the CKD-EPI Creatinine Equation (2021)    eGFR  Date Value Ref Range Status  02/26/2023 83 >59 mL/min/1.73 Final         Passed - Patient is not pregnant      Passed - Valid encounter  within last 12 months    Recent Outpatient Visits           1 month ago Acute right-sided low back pain with right-sided sciatica   [place] [facility] [name], FNP   1 year ago Dizziness   [place] [facility] [place], Shea Stakes, NP   1 year ago Dizziness and giddiness   [place] [facility], [place], [place]   2 years ago Acute bilateral low back pain with sciatica, sciatica laterality unspecified   [place] [facility] Jonah Blue B, MD   2 years ago Acute right-sided low back pain with right-sided sciatica   [facility] Claiborne Rigg, NP       Future Appointments             In 2 months Claiborne Rigg, NP [facility]   In 3 months O'Neal, Ronnald Ramp, MD  [facility]

## 2023-06-16 NOTE — Telephone Encounter (Signed)
Medication Refill - Medication: naproxen (NAPROSYN) 375 MG tablet   Has the patient contacted their pharmacy? Yes.    (Agent: If yes, when and what did the pharmacy advise?) Contact PCP   Preferred Pharmacy (with phone number or street name): [facility] [address]   Has the patient been seen for an appointment in the last year OR does the patient have an upcoming appointment? Yes.    Agent: Please be advised that RX refills may take up to 3 business days. We ask that you follow-up with your pharmacy.   Pt wants to know what is holding this medication up. Pt says he has been trying to get this medication for a week. Pt is requesting someone call him back.

## 2023-06-29 ENCOUNTER — Other Ambulatory Visit: Payer: Self-pay

## 2023-07-14 ENCOUNTER — Other Ambulatory Visit: Payer: Self-pay

## 2023-07-14 ENCOUNTER — Encounter (HOSPITAL_COMMUNITY): Payer: Self-pay

## 2023-07-14 ENCOUNTER — Ambulatory Visit (HOSPITAL_COMMUNITY): Payer: BC Managed Care – PPO

## 2023-07-14 ENCOUNTER — Ambulatory Visit (HOSPITAL_COMMUNITY)
Admission: EM | Admit: 2023-07-14 | Discharge: 2023-07-14 | Disposition: A | Discharge status: Home / Self Care | Payer: BC Managed Care – PPO | Attending: Emergency Medicine | Admitting: Emergency Medicine

## 2023-07-14 DIAGNOSIS — J069 Acute upper respiratory infection, unspecified: Secondary | ICD-10-CM

## 2023-07-14 DIAGNOSIS — R051 Acute cough: Secondary | ICD-10-CM | POA: Diagnosis not present

## 2023-07-14 MED ORDER — BENZONATATE 200 MG PO CAPS
200.0000 mg | ORAL_CAPSULE | Freq: Three times a day (TID) | ORAL | 0 refills | Status: DC
  Filled 2023-07-14: qty 30, 10d supply, fill #0

## 2023-07-14 MED ORDER — AZITHROMYCIN 250 MG PO TABS
250.0000 mg | ORAL_TABLET | Freq: Every day | ORAL | 0 refills | Status: DC
  Filled 2023-07-14: qty 6, 5d supply, fill #0

## 2023-07-14 NOTE — ED Provider Notes (Signed)
[facility]    CSN: [id number] Arrival date & time: 07/14/23  0272      History   Chief Complaint Chief Complaint  Patient presents with   Cough    HPI Nicholas Espinoza is a 62 y.o. male.   Patient presents to clinic with complaints of cough, sore throat, rhinorrhea, chills, sweats and bodyaches for the past 2 weeks.  The cough is mostly nonproductive, sometimes he will cough up phlegm.  He has had no wheezing or shortness of breath.  Denies any fatigue.   He has tried Tylenol for symptoms, these have not really helped.  Denies any recent sick contacts.  He denies any history of asthma or respiratory issues.  He smokes cigarettes maybe every other day.    The history is provided by the patient and medical records.  Cough   Past Medical History:  Diagnosis Date   Hyperlipidemia    Left ventricular diastolic dysfunction, NYHA class 1     Patient Active Problem List   Diagnosis Date Noted   Thrombocytopenia (HCC) 03/04/2022   Acute right-sided low back pain with right-sided sciatica 12/06/2020   Peripheral vascular disease, unspecified (HCC) 07/30/2020   Palpitations 04/04/2020   Abnormal ECG 04/04/2020    Past Surgical History:  Procedure Laterality Date   ABDOMINAL AORTOGRAM W/LOWER EXTREMITY Bilateral 09/02/2020   Procedure: ABDOMINAL AORTOGRAM W/LOWER EXTREMITY;  Surgeon: Runell Gess, MD;  [facility];  Service: Cardiovascular;  Laterality: Bilateral;   DENTAL SURGERY     PERIPHERAL VASCULAR ATHERECTOMY Left 09/02/2020   Procedure: PERIPHERAL VASCULAR ATHERECTOMY;  Surgeon: Runell Gess, MD;  Location: [facility] INVASIVE CV LAB;  Service: Cardiovascular;  Laterality: Left;   PERIPHERAL VASCULAR INTERVENTION Left 09/02/2020   Procedure: PERIPHERAL VASCULAR INTERVENTION;  Surgeon: Runell Gess, MD;  Location: [facility];  Service: Cardiovascular;  Laterality: Left;       Home Medications    Prior to Admission  medications   Medication Sig Start Date End Date Taking? Authorizing Provider  azithromycin (ZITHROMAX) 250 MG tablet Take first 2 tablets together, then 1 every day until finished. 07/14/23  Yes Rinaldo Ratel, [place] N, FNP  benzonatate (TESSALON) 200 MG capsule Take 1 capsule (200 mg total) by mouth every 8 (eight) hours. 07/14/23  Yes Rinaldo Ratel, [place] N, FNP  aspirin EC 81 MG tablet Take 81 mg by mouth daily. Swallow whole.    [provider]  clopidogrel (PLAVIX) 75 MG tablet Take 1 tablet (75 mg total) by mouth daily with breakfast. 09/03/22   Runell Gess, MD  gabapentin (NEURONTIN) 300 MG capsule TAKE 1 CAPSULE (300 MG TOTAL) BY MOUTH 3 (THREE) TIMES DAILY. 05/15/23 05/14/24  [name], FNP  rosuvastatin (CRESTOR) 40 MG tablet Take 1 tablet (40 mg total) by mouth daily. 03/16/23 09/27/23  Sande Rives, MD  albuterol (PROVENTIL HFA;VENTOLIN HFA) 108 (90 Base) MCG/ACT inhaler Inhale 2 puffs into the lungs every 6 (six) hours as needed for wheezing or shortness of breath. 07/11/16 04/08/20  Lorre Nick, MD    Family History Family History  Problem Relation Age of Onset   Heart attack Mother    Cancer - Other Father    Diabetes Sister    Diabetes Sister    Colon cancer Brother    Heart attack Brother 60   Esophageal cancer Neg Hx    Rectal cancer Neg Hx    Stomach cancer Neg Hx     Social History Social History  Tobacco Use   Smoking status: Some Days    Current packs/day: 0.25    Average packs/day: 0.3 packs/day for 0.5 years (0.1 ttl pk-yrs)    Types: Cigarettes   Smokeless tobacco: Never   Tobacco comments:    less then 1 cigarette daily  Vaping Use   Vaping status: Never Used  Substance Use Topics   Alcohol use: Yes    Comment: occ   Drug use: Yes    Types: Marijuana     Allergies   Patient has no known allergies.   Review of Systems Review of Systems  Per HPI   Physical Exam Triage Vital Signs ED Triage Vitals  Encounter  Vitals Group     BP 07/14/23 0826 130/81     Systolic BP Percentile --      Diastolic BP Percentile --      Pulse Rate 07/14/23 0826 61     Resp 07/14/23 0826 16     Temp 07/14/23 0826 97.8 F (36.6 C)     Temp Source 07/14/23 0826 Oral     SpO2 07/14/23 0826 97 %     Weight 07/14/23 0825 128 lb (58.1 kg)     Height 07/14/23 0825 5\' 9"  (1.753 m)     Head Circumference --      Peak Flow --      Pain Score 07/14/23 0823 8     Pain Loc --      Pain Education --      Exclude from Growth Chart --    No data found.  Updated Vital Signs BP 130/81 (BP Location: Left Arm)   Pulse 61   Temp 97.8 F (36.6 C) (Oral)   Resp 16   Ht 5\' 9"  (1.753 m)   Wt 128 lb (58.1 kg)   SpO2 97%   BMI 18.90 kg/m   Visual Acuity Right Eye Distance:   Left Eye Distance:   Bilateral Distance:    Right Eye Near:   Left Eye Near:    Bilateral Near:     Physical Exam Vitals and nursing note reviewed.  Constitutional:      Appearance: Normal appearance.  HENT:     Head: Normocephalic and atraumatic.     Right Ear: External ear normal.     Left Ear: External ear normal.     Nose: Congestion and rhinorrhea present.     Mouth/Throat:     Mouth: Mucous membranes are moist.     Pharynx: Posterior oropharyngeal erythema present.  Eyes:     Pupils: Pupils are equal, round, and reactive to light.  Cardiovascular:     Rate and Rhythm: Normal rate and regular rhythm.     Heart sounds: Normal heart sounds. No murmur heard. Pulmonary:     Effort: Pulmonary effort is normal. No respiratory distress.     Breath sounds: Normal breath sounds.  Musculoskeletal:        General: Normal range of motion.     Cervical back: Normal range of motion.  Lymphadenopathy:     Cervical: Cervical adenopathy present.  Skin:    General: Skin is warm and dry.  Neurological:     General: No focal deficit present.     Mental Status: He is alert.  Psychiatric:        Mood and Affect: Mood normal.      UC  Treatments / Results  Labs (all labs ordered are listed, but only abnormal results are displayed) Labs Reviewed -  No data to display  EKG   Radiology DG Chest 2 View  Result Date: 07/14/2023 CLINICAL DATA:  Cough for 2 weeks EXAM: CHEST - 2 VIEW COMPARISON:  None Available. FINDINGS: Normal mediastinum and cardiac silhouette. Normal pulmonary vasculature. No evidence of effusion, infiltrate, or pneumothorax. No acute bony abnormality. IMPRESSION: No acute cardiopulmonary process. Electronically Signed   By: Genevive Bi M.D.   On: 07/14/2023 12:02    Procedures Procedures (including critical care time)  Medications Ordered in UC Medications - No data to display  Initial Impression / Assessment and Plan / UC Course  I have reviewed the triage vital signs and the nursing notes.  Pertinent labs & imaging results that were available during my care of the patient were reviewed by me and considered in my medical decision making (see chart for details).  Vitals and triage reviewed, patient is hemodynamically stable.  Lungs are vesicular, heart with regular rate and rhythm.  Cervical LAD, congestion, rhinorrhea and postnasal drip present.  Chest x-ray awaiting official radiology overread.  Will cover with azithromycin due to high rates of atypical pneumonia and prolonged duration of symptoms.  Symptomatic cough management discussed.  Plan of care, follow-up care and return precautions given, no questions at this time.     Final Clinical Impressions(s) / UC Diagnoses   Final diagnoses:  Upper respiratory tract infection, unspecified type  Acute cough     Discharge Instructions      Take the antibiotics as prescribed and until finished.  You can take them with food to prevent gastrointestinal upset.  For your cough and take the Tessalon Perles every 8 hours.  He can also do warm saline gargles, tea with honey and sleeping with a humidifier for symptomatic management.  Your symptoms  should improve with antibiotics, if no improvement or any changes please return to clinic or follow-up with your primary care provider.     ED Prescriptions     Medication Sig Dispense Auth. Provider   azithromycin (ZITHROMAX) 250 MG tablet Take first 2 tablets together, then 1 every day until finished. 6 tablet Rinaldo Ratel, [place] N, [place]   benzonatate (TESSALON) 200 MG capsule Take 1 capsule (200 mg total) by mouth every 8 (eight) hours. 30 capsule Kiamesha Samet, [place] N, [place]      PDMP not reviewed this encounter.   Avonelle Viveros, [place] N, [place] 07/14/23 1209

## 2023-07-14 NOTE — ED Triage Notes (Signed)
Patient here today with c/o cough, ST, runny nose, chills, sweats, and body aches X 2 weeks. He has been taking Tylenol with no relief. No sick contacts. Patient states that he started feeling like this after putting hit [facility] on in the home one day when it got hot.

## 2023-07-14 NOTE — Discharge Instructions (Addendum)
Take the antibiotics as prescribed and until finished.  You can take them with food to prevent gastrointestinal upset.  For your cough and take the Tessalon Perles every 8 hours.  He can also do warm saline gargles, tea with honey and sleeping with a humidifier for symptomatic management.  Your symptoms should improve with antibiotics, if no improvement or any changes please return to clinic or follow-up with your primary care provider.

## 2023-07-26 ENCOUNTER — Telehealth: Payer: Self-pay | Admitting: Gastroenterology

## 2023-07-26 NOTE — Telephone Encounter (Signed)
Patient needing to reschedule hospital procedure. Please advise.

## 2023-07-27 NOTE — Telephone Encounter (Signed)
The pt wants to cancel the upcoming procedure on 12/16. He will call back when he is ready to get back on the schedule

## 2023-08-03 ENCOUNTER — Other Ambulatory Visit: Payer: Self-pay

## 2023-08-05 ENCOUNTER — Telehealth: Payer: Self-pay | Admitting: Gastroenterology

## 2023-08-05 ENCOUNTER — Other Ambulatory Visit: Payer: Self-pay

## 2023-08-05 DIAGNOSIS — K319 Disease of stomach and duodenum, unspecified: Secondary | ICD-10-CM

## 2023-08-05 NOTE — Telephone Encounter (Signed)
Inbound call from patient stating that he wants to reschedule his procedure with Dr. Meridee Score at [facility] for EUS. Please advise.

## 2023-08-05 NOTE — Telephone Encounter (Signed)
EUS scheduled, pt instructed and medications reviewed.  Patient instructions mailed to home.  Patient to call with any questions or concerns.  

## 2023-08-05 NOTE — Telephone Encounter (Signed)
 EUS has been set up for 09/09/23 at 1015 am at [facility] with GM   Left message on machine to call back

## 2023-08-09 ENCOUNTER — Encounter (HOSPITAL_COMMUNITY): Admission: RE | Payer: Self-pay | Source: Home / Self Care

## 2023-08-09 ENCOUNTER — Ambulatory Visit (HOSPITAL_COMMUNITY)
Admission: RE | Admit: 2023-08-09 | Payer: BC Managed Care – PPO | Source: Home / Self Care | Admitting: Gastroenterology

## 2023-08-09 SURGERY — UPPER ENDOSCOPIC ULTRASOUND (EUS) RADIAL
Anesthesia: Monitor Anesthesia Care

## 2023-08-31 ENCOUNTER — Encounter (HOSPITAL_COMMUNITY): Payer: Self-pay | Admitting: Gastroenterology

## 2023-09-01 ENCOUNTER — Other Ambulatory Visit: Payer: Self-pay

## 2023-09-01 ENCOUNTER — Ambulatory Visit (HOSPITAL_COMMUNITY)
Admission: EM | Admit: 2023-09-01 | Discharge: 2023-09-01 | Disposition: A | Discharge status: Home / Self Care | Payer: No Typology Code available for payment source | Attending: Family Medicine | Admitting: Family Medicine

## 2023-09-01 ENCOUNTER — Encounter (HOSPITAL_COMMUNITY): Payer: Self-pay

## 2023-09-01 DIAGNOSIS — J069 Acute upper respiratory infection, unspecified: Secondary | ICD-10-CM | POA: Diagnosis not present

## 2023-09-01 NOTE — ED Provider Notes (Signed)
 [facility]   [id number] 09/01/23 Arrival Time: 1125  ASSESSMENT & PLAN:  1. Viral URI with cough    Discussed typical duration of likely viral illness. OTC symptom care as needed.  Has Tessalon  at home to use. OTC as needed.  Work note provided.   Follow-up Information     Theotis Haze ORN, NP.   Specialty: Nurse Practitioner Why: As needed. Contact information: [address] [place] [address] [phone]                Reviewed expectations re: course of current medical issues. Questions answered. Outlined signs and symptoms indicating need for more acute intervention. Understanding verbalized. After Visit Summary given.   SUBJECTIVE: History from: Patient. Nicholas Espinoza is a 63 y.o. male. Patient here today with cough, runny nose, and sinus pressure X 3 days. Some of his coworkers have also been sick.  Denies: fever and difficulty breathing. Normal PO intake without n/v/d.  OBJECTIVE:  Vitals:   09/01/23 1300  BP: 117/82  Pulse: 77  Resp: 16  Temp: 97.9 F (36.6 C)  TempSrc: Oral  SpO2: 98%  Weight: 58.1 kg  Height: 5' 9 (1.753 m)    General appearance: alert; no distress Eyes: PERRLA; EOMI; conjunctiva normal HENT: [place]; AT; with nasal congestion Neck: supple  Lungs: speaks full sentences without difficulty; unlabored; CTAB Extremities: no edema Skin: warm and dry Psychological: alert and cooperative; normal mood and affect  No Known Allergies  Past Medical History:  Diagnosis Date   Hyperlipidemia    Left ventricular diastolic dysfunction, NYHA class 1    Social History   Socioeconomic History   Marital status: Single    Spouse name: Not on file   Number of children: 4   Years of education: Not on file   Highest education level: Not on file  Occupational History   Occupation: unemployed  Tobacco Use   Smoking status: Some Days    Current packs/day: 0.25    Average packs/day: 0.3 packs/day for  0.5 years (0.1 ttl pk-yrs)    Types: Cigarettes   Smokeless tobacco: Never   Tobacco comments:    less then 1 cigarette daily  Vaping Use   Vaping status: Never Used  Substance and Sexual Activity   Alcohol use: Yes    Comment: occ   Drug use: Yes    Types: Marijuana   Sexual activity: Yes  Other Topics Concern   Not on file  Social History Narrative   Not on file   Social Drivers of Health   Financial Resource Strain: Low Risk  (03/04/2023)   Overall Financial Resource Strain (CARDIA)    Difficulty of Paying Living Expenses: Not very hard  Food Insecurity: No Food Insecurity (03/04/2023)   Hunger Vital Sign    Worried About Running Out of Food in the Last Year: Never true    Ran Out of Food in the Last Year: Never true  Transportation Needs: No Transportation Needs (03/04/2023)   PRAPARE - Administrator, Civil Service (Medical): No    Lack of Transportation (Non-Medical): No  Physical Activity: Not on file  Stress: Not on file  Social Connections: Not on file  Intimate Partner Violence: Not on file   Family History  Problem Relation Age of Onset   Heart attack Mother    Cancer - Other Father    Diabetes Sister    Diabetes Sister    Colon cancer Brother  Heart attack Brother 44   Esophageal cancer Neg Hx    Rectal cancer Neg Hx    Stomach cancer Neg Hx    Past Surgical History:  Procedure Laterality Date   ABDOMINAL AORTOGRAM W/LOWER EXTREMITY Bilateral 09/02/2020   Procedure: ABDOMINAL AORTOGRAM W/LOWER EXTREMITY;  Surgeon: Court Dorn PARAS, MD;  [facility];  Service: Cardiovascular;  Laterality: Bilateral;   DENTAL SURGERY     PERIPHERAL VASCULAR ATHERECTOMY Left 09/02/2020   Procedure: PERIPHERAL VASCULAR ATHERECTOMY;  Surgeon: Court Dorn PARAS, MD;  Location: [facility] INVASIVE CV LAB;  Service: Cardiovascular;  Laterality: Left;   PERIPHERAL VASCULAR INTERVENTION Left 09/02/2020   Procedure: PERIPHERAL VASCULAR INTERVENTION;  Surgeon:  Court Dorn PARAS, MD;  Location: [facility];  Service: Cardiovascular;  Laterality: Left;     Rolinda Rogue, MD 09/01/23 [phone]

## 2023-09-01 NOTE — ED Triage Notes (Signed)
 Patient here today with cough, runny nose, and sinus pressure X 3 days. Some of his coworkers have also been sick.

## 2023-09-03 ENCOUNTER — Other Ambulatory Visit: Payer: Self-pay | Admitting: Nurse Practitioner

## 2023-09-03 ENCOUNTER — Encounter: Payer: Self-pay | Admitting: Nurse Practitioner

## 2023-09-03 ENCOUNTER — Telehealth: Payer: Self-pay | Admitting: Nurse Practitioner

## 2023-09-03 ENCOUNTER — Ambulatory Visit: Payer: No Typology Code available for payment source | Attending: Nurse Practitioner | Admitting: Nurse Practitioner

## 2023-09-03 VITALS — BP 111/72 | HR 70 | Ht 69.0 in | Wt 126.0 lb

## 2023-09-03 DIAGNOSIS — Z122 Encounter for screening for malignant neoplasm of respiratory organs: Secondary | ICD-10-CM

## 2023-09-03 DIAGNOSIS — Z139 Encounter for screening, unspecified: Secondary | ICD-10-CM

## 2023-09-03 DIAGNOSIS — E870 Hyperosmolality and hypernatremia: Secondary | ICD-10-CM | POA: Diagnosis not present

## 2023-09-03 DIAGNOSIS — D508 Other iron deficiency anemias: Secondary | ICD-10-CM | POA: Diagnosis not present

## 2023-09-03 DIAGNOSIS — E785 Hyperlipidemia, unspecified: Secondary | ICD-10-CM

## 2023-09-03 DIAGNOSIS — Z23 Encounter for immunization: Secondary | ICD-10-CM

## 2023-09-03 DIAGNOSIS — M5441 Lumbago with sciatica, right side: Secondary | ICD-10-CM

## 2023-09-03 DIAGNOSIS — I739 Peripheral vascular disease, unspecified: Secondary | ICD-10-CM

## 2023-09-03 DIAGNOSIS — F17200 Nicotine dependence, unspecified, uncomplicated: Secondary | ICD-10-CM

## 2023-09-03 MED ORDER — GABAPENTIN 300 MG PO CAPS: 300.0000 mg | ORAL_CAPSULE | Freq: Three times a day (TID) | ORAL | Status: DC

## 2023-09-03 NOTE — Telephone Encounter (Signed)
 Verlon Au with [facility] needs revised order for pt for ct chest w/o contrast. Fax [phone].

## 2023-09-03 NOTE — Progress Notes (Signed)
 Assessment & Plan:  Nicholas Espinoza was seen today for medical management of chronic issues.  Diagnoses and all orders for this visit:  Acute right-sided low back pain with right-sided sciatica -     gabapentin  (NEURONTIN ) 300 MG capsule; Take 1 capsule (300 mg total) by mouth 3 (three) times daily.  Screening for lung cancer -     CT CHEST LUNG CA SCREEN LOW DOSE W/O CM; Future  Serum sodium elevated -     CMP14+EGFR  Other iron deficiency anemia -     CBC with Differential  Dyslipidemia, goal LDL below 100 -     Lipid panel  Influenza vaccine administered -     Flu vaccine trivalent PF, 6mos and older(Flulaval,Afluria,Fluarix,Fluzone)  Encounter for screening involving social determinants of health (SDoH) -     AMB Referral [facility] Care Management  Need for pneumococcal 20-valent conjugate vaccination -     Pneumococcal conjugate vaccine 20-valent  Peripheral vascular disease, unspecified (HCC) Continue plavix  as prescribed    Patient has been counseled on age-appropriate routine health concerns for screening and prevention. These are reviewed and up-to-date. Referrals have been placed accordingly. Immunizations are up-to-date or declined.    Subjective:   Chief Complaint  Patient presents with   Medical Management of Chronic Issues    Nicholas Espinoza 64 y.o. male presents to office today for follow up to low back pain.    PMH: PAD, tobacco depedende, chronic back pain s/p   Nicholas Espinoza was seen by another PCP with this office on 05-15-2023 with complaints of acute right sided low back pain with right sided radiculopathy. At that time he reportedly had been experiencing back pain several months prior to his appt in September. Medications tried: percocet, tylenol  and gabapentin  with gabapentin  being the most effective. He is taking this as needed. States back pain is controlled today. He has not worked with PT in the past for this so this would be the next step to manage his pain  if returns.   He has a follow up appt with Cardiology in a few weeks. He continues to take plavix  daily as prescribed. Denies any new symptoms of claudication today       Review of Systems  Constitutional:  Negative for fever, malaise/fatigue and weight loss.  HENT: Negative.  Negative for nosebleeds.   Eyes: Negative.  Negative for blurred vision, double vision and photophobia.  Respiratory: Negative.  Negative for cough and shortness of breath.   Cardiovascular: Negative.  Negative for chest pain, palpitations and leg swelling.  Gastrointestinal: Negative.  Negative for heartburn, nausea and vomiting.  Musculoskeletal: Negative.  Negative for myalgias.  Neurological: Negative.  Negative for dizziness, focal weakness, seizures and headaches.  Psychiatric/Behavioral: Negative.  Negative for suicidal ideas.     Past Medical History:  Diagnosis Date   Hyperlipidemia    Left ventricular diastolic dysfunction, NYHA class 1     Past Surgical History:  Procedure Laterality Date   ABDOMINAL AORTOGRAM W/LOWER EXTREMITY Bilateral 09/02/2020   Procedure: ABDOMINAL AORTOGRAM W/LOWER EXTREMITY;  Surgeon: Court Dorn PARAS, MD;  Location: [facility] INVASIVE CV LAB;  Service: Cardiovascular;  Laterality: Bilateral;   DENTAL SURGERY     PERIPHERAL VASCULAR ATHERECTOMY Left 09/02/2020   Procedure: PERIPHERAL VASCULAR ATHERECTOMY;  Surgeon: Court Dorn PARAS, MD;  Location: [facility] INVASIVE CV LAB;  Service: Cardiovascular;  Laterality: Left;   PERIPHERAL VASCULAR INTERVENTION Left 09/02/2020   Procedure: PERIPHERAL VASCULAR INTERVENTION;  Surgeon: Court Dorn PARAS, MD;  Location: [facility] INVASIVE CV LAB;  Service: Cardiovascular;  Laterality: Left;    Family History  Problem Relation Age of Onset   Heart attack Mother    Cancer - Other Father    Diabetes Sister    Diabetes Sister    Colon cancer Brother    Heart attack Brother 28   Esophageal cancer Neg Hx    Rectal cancer Neg Hx    Stomach cancer Neg Hx      Social History Reviewed with no changes to be made today.   Outpatient Medications Prior to Visit  Medication Sig Dispense Refill   clopidogrel  (PLAVIX ) 75 MG tablet Take 75 mg by mouth daily.     Facility-Administered Medications Prior to Visit  Medication Dose Route Frequency Provider Last Rate Last Admin   sodium chloride  flush (NS) 0.9 % injection 3 mL  3 mL Intravenous Q12H Court Dorn PARAS, MD        No Known Allergies     Objective:    BP 111/72 (BP Location: Left Arm, Patient Position: Sitting, Cuff Size: Normal)   Pulse 70   Ht 5' 9 (1.753 m)   Wt 126 lb (57.2 kg)   SpO2 100%   BMI 18.61 kg/m  Wt Readings from Last 3 Encounters:  09/03/23 126 lb (57.2 kg)  09/01/23 128 lb (58.1 kg)  07/14/23 128 lb (58.1 kg)    Physical Exam Vitals and nursing note reviewed.  Constitutional:      Appearance: He is well-developed.  HENT:     Head: Normocephalic and atraumatic.  Cardiovascular:     Rate and Rhythm: Normal rate and regular rhythm.     Heart sounds: Normal heart sounds. No murmur heard.    No friction rub. No gallop.  Pulmonary:     Effort: Pulmonary effort is normal. No tachypnea or respiratory distress.     Breath sounds: Normal breath sounds. No decreased breath sounds, wheezing, rhonchi or rales.  Chest:     Chest wall: No tenderness.  Abdominal:     General: Bowel sounds are normal.     Palpations: Abdomen is soft.  Musculoskeletal:        General: Normal range of motion.     Cervical back: Normal range of motion.  Skin:    General: Skin is warm and dry.  Neurological:     Mental Status: He is alert and oriented to person, place, and time.     Coordination: Coordination normal.  Psychiatric:        Behavior: Behavior normal. Behavior is cooperative.        Thought Content: Thought content normal.        Judgment: Judgment normal.          Patient has been counseled extensively about nutrition and exercise as well as the importance of  adherence with medications and regular follow-up. The patient was given clear instructions to go to ER or return to medical center if symptoms don't improve, worsen or new problems develop. The patient verbalized understanding.   Follow-up: Return in about 6 months (around 03/02/2024) for PHYSICAL.   Haze LELON Servant, FNP-BC [facility] [place], [place] [phone]   09/03/2023, 2:00 PM

## 2023-09-04 LAB — CMP14+EGFR
ALT: 31 [IU]/L (ref 0–44)
AST: 29 [IU]/L (ref 0–40)
Albumin: 4.2 g/dL (ref 3.9–4.9)
Alkaline Phosphatase: 74 [IU]/L (ref 44–121)
BUN/Creatinine Ratio: 17 (ref 10–24)
BUN: 16 mg/dL (ref 8–27)
Bilirubin Total: 0.6 mg/dL (ref 0.0–1.2)
CO2: 26 mmol/L (ref 20–29)
Calcium: 9.2 mg/dL (ref 8.6–10.2)
Chloride: 102 mmol/L (ref 96–106)
Creatinine, Ser: 0.92 mg/dL (ref 0.76–1.27)
Globulin, Total: 2.9 g/dL (ref 1.5–4.5)
Glucose: 74 mg/dL (ref 70–99)
Potassium: 4.6 mmol/L (ref 3.5–5.2)
Sodium: 141 mmol/L (ref 134–144)
Total Protein: 7.1 g/dL (ref 6.0–8.5)
eGFR: 95 mL/min/{1.73_m2} (ref >59)

## 2023-09-04 LAB — CBC WITH DIFFERENTIAL/PLATELET
Basophils Absolute: 0 10*3/uL (ref 0.0–0.2)
Basos: 1 %
EOS (ABSOLUTE): 0.1 10*3/uL (ref 0.0–0.4)
Eos: 2 %
Hematocrit: 40.2 % (ref 37.5–51.0)
Hemoglobin: 13.5 g/dL (ref 13.0–17.7)
Immature Grans (Abs): 0 10*3/uL (ref 0.0–0.1)
Immature Granulocytes: 0 %
Lymphocytes Absolute: 2 10*3/uL (ref 0.7–3.1)
Lymphs: 29 %
MCH: 32.5 pg (ref 26.6–33.0)
MCHC: 33.6 g/dL (ref 31.5–35.7)
MCV: 97 fL (ref 79–97)
Monocytes Absolute: 0.6 10*3/uL (ref 0.1–0.9)
Monocytes: 8 %
Neutrophils Absolute: 4.1 10*3/uL (ref 1.4–7.0)
Neutrophils: 60 %
Platelets: 167 10*3/uL (ref 150–450)
RBC: 4.15 x10E6/uL (ref 4.14–5.80)
RDW: 12.8 % (ref 11.6–15.4)
WBC: 6.9 10*3/uL (ref 3.4–10.8)

## 2023-09-04 LAB — LIPID PANEL
Chol/HDL Ratio: 2.5 {ratio} (ref 0.0–5.0)
Cholesterol, Total: 135 mg/dL (ref 100–199)
HDL: 55 mg/dL (ref >39)
LDL Chol Calc (NIH): 61 mg/dL (ref 0–99)
Triglycerides: 106 mg/dL (ref 0–149)
VLDL Cholesterol Cal: 19 mg/dL (ref 5–40)

## 2023-09-06 ENCOUNTER — Telehealth: Payer: Self-pay | Admitting: *Deleted

## 2023-09-06 NOTE — Progress Notes (Signed)
 Complex Care Management Note Care Guide Note  09/06/2023 Name: Nicholas Espinoza MRN: [id number] DOB: 10-22-1960   Complex Care Management Outreach Attempts: An unsuccessful telephone outreach was attempted today to offer the patient information about available complex care management services.  Follow Up Plan:  Additional outreach attempts will be made to offer the patient complex care management information and services.   Encounter Outcome:  No Answer  Harlene Satterfield  Care Coordination Care Guide  Direct Dial: [phone]

## 2023-09-08 ENCOUNTER — Telehealth: Payer: Self-pay | Admitting: Gastroenterology

## 2023-09-08 ENCOUNTER — Encounter (HOSPITAL_COMMUNITY): Payer: Self-pay | Admitting: Gastroenterology

## 2023-09-08 NOTE — Anesthesia Preprocedure Evaluation (Addendum)
Anesthesia Evaluation  Patient identified by MRN, date of birth, ID band Patient awake    Reviewed: Allergy & Precautions, NPO status , Patient's Chart, lab work & pertinent test results  Airway Mallampati: I  TM Distance: >3 FB Neck ROM: Full    Dental no notable dental hx. (+) Edentulous Upper, Edentulous Lower   Pulmonary Current Smoker and Patient abstained from smoking.   Pulmonary exam normal breath sounds clear to auscultation       Cardiovascular + Peripheral Vascular Disease  negative cardio ROS Normal cardiovascular exam Rhythm:Regular Rate:Normal     Neuro/Psych  Neuromuscular disease    GI/Hepatic negative GI ROS, Neg liver ROS,,,  Endo/Other  negative endocrine ROS    Renal/GU      Musculoskeletal negative musculoskeletal ROS (+)    Abdominal   Peds  Hematology   Anesthesia Other Findings   Reproductive/Obstetrics                             Anesthesia Physical Anesthesia Plan  ASA: 3  Anesthesia Plan: MAC   Post-op Pain Management: Minimal or no pain anticipated   Induction:   PONV Risk Score and Plan: 0 and Treatment may vary due to age or medical condition and Propofol infusion  Airway Management Planned: Natural Airway and Simple Face Mask  Additional Equipment: None  Intra-op Plan:   Post-operative Plan:   Informed Consent: I have reviewed the patients History and Physical, chart, labs and discussed the procedure including the risks, benefits and alternatives for the proposed anesthesia with the patient or authorized representative who has indicated his/her understanding and acceptance.     Dental advisory given  Plan Discussed with: CRNA and Anesthesiologist  Anesthesia Plan Comments: (EUS for Stomach lesions)        Anesthesia Quick Evaluation

## 2023-09-08 NOTE — Telephone Encounter (Signed)
 Inbound call from patients insurance [facility]  stating that patient has been denied for his procedure tomorrow at 10:15 for EUS with Dr. Brice Campi at [facility]. Advised that he was denied due to no clinical information.  Provided [phone] to call to appeal with reference number [phone]

## 2023-09-09 ENCOUNTER — Ambulatory Visit (HOSPITAL_COMMUNITY)
Admission: RE | Admit: 2023-09-09 | Discharge: 2023-09-09 | Disposition: A | Discharge status: Home / Self Care | Payer: 59 | Attending: Gastroenterology | Admitting: Gastroenterology

## 2023-09-09 ENCOUNTER — Other Ambulatory Visit: Payer: Self-pay

## 2023-09-09 ENCOUNTER — Encounter (HOSPITAL_COMMUNITY)
Admission: RE | Disposition: A | Discharge status: Home / Self Care | Payer: Self-pay | Source: Home / Self Care | Attending: Gastroenterology

## 2023-09-09 ENCOUNTER — Ambulatory Visit (HOSPITAL_BASED_OUTPATIENT_CLINIC_OR_DEPARTMENT_OTHER): Payer: 59 | Admitting: Anesthesiology

## 2023-09-09 ENCOUNTER — Ambulatory Visit (HOSPITAL_COMMUNITY): Payer: Self-pay | Admitting: Anesthesiology

## 2023-09-09 ENCOUNTER — Encounter (HOSPITAL_COMMUNITY): Payer: Self-pay | Admitting: Gastroenterology

## 2023-09-09 DIAGNOSIS — K3189 Other diseases of stomach and duodenum: Secondary | ICD-10-CM

## 2023-09-09 DIAGNOSIS — K2289 Other specified disease of esophagus: Secondary | ICD-10-CM

## 2023-09-09 DIAGNOSIS — F17210 Nicotine dependence, cigarettes, uncomplicated: Secondary | ICD-10-CM | POA: Diagnosis not present

## 2023-09-09 DIAGNOSIS — I739 Peripheral vascular disease, unspecified: Secondary | ICD-10-CM | POA: Insufficient documentation

## 2023-09-09 DIAGNOSIS — I899 Noninfective disorder of lymphatic vessels and lymph nodes, unspecified: Secondary | ICD-10-CM

## 2023-09-09 DIAGNOSIS — K2970 Gastritis, unspecified, without bleeding: Secondary | ICD-10-CM | POA: Diagnosis not present

## 2023-09-09 DIAGNOSIS — K319 Disease of stomach and duodenum, unspecified: Secondary | ICD-10-CM

## 2023-09-09 HISTORY — PX: EUS: SHX5427

## 2023-09-09 HISTORY — PX: BIOPSY: SHX5522

## 2023-09-09 HISTORY — PX: ESOPHAGOGASTRODUODENOSCOPY: SHX5428

## 2023-09-09 SURGERY — UPPER ENDOSCOPIC ULTRASOUND (EUS) RADIAL
Anesthesia: Monitor Anesthesia Care

## 2023-09-09 MED ORDER — PROPOFOL 500 MG/50ML IV EMUL
INTRAVENOUS | Status: AC
  Filled 2023-09-09: qty 50

## 2023-09-09 MED ORDER — PROPOFOL 10 MG/ML IV BOLUS
INTRAVENOUS | Status: DC | PRN
  Administered 2023-09-09: 40 mg via INTRAVENOUS

## 2023-09-09 MED ORDER — PHENYLEPHRINE HCL (PRESSORS) 10 MG/ML IV SOLN
INTRAVENOUS | Status: DC | PRN
  Administered 2023-09-09 (×2): 120 ug via INTRAVENOUS

## 2023-09-09 MED ORDER — SODIUM CHLORIDE 0.9 % IV SOLN
INTRAVENOUS | Status: DC
Start: 2023-09-09 — End: 2023-09-09

## 2023-09-09 MED ORDER — LIDOCAINE HCL (CARDIAC) PF 100 MG/5ML IV SOSY
PREFILLED_SYRINGE | INTRAVENOUS | Status: DC | PRN
  Administered 2023-09-09: 80 mg via INTRAVENOUS

## 2023-09-09 MED ORDER — CIPROFLOXACIN IN D5W 400 MG/200ML IV SOLN
INTRAVENOUS | Status: AC
  Filled 2023-09-09: qty 200

## 2023-09-09 MED ORDER — PANTOPRAZOLE SODIUM 40 MG PO TBEC
40.0000 mg | DELAYED_RELEASE_TABLET | Freq: Two times a day (BID) | ORAL | 12 refills | Status: AC
  Filled 2023-09-09: qty 60, 30d supply, fill #0
  Filled 2023-11-11: qty 60, 30d supply, fill #1
  Filled 2024-01-06: qty 60, 30d supply, fill #2
  Filled 2024-07-24: qty 60, 30d supply, fill #3

## 2023-09-09 MED ORDER — SUCRALFATE 1 G PO TABS
1.0000 g | ORAL_TABLET | Freq: Two times a day (BID) | ORAL | 2 refills | Status: DC
  Filled 2023-09-09: qty 60, 30d supply, fill #0
  Filled 2023-11-11: qty 60, 30d supply, fill #1
  Filled 2024-01-06: qty 60, 30d supply, fill #2

## 2023-09-09 MED ORDER — CIPROFLOXACIN IN D5W 400 MG/200ML IV SOLN
400.0000 mg | Freq: Once | INTRAVENOUS | Status: AC
  Administered 2023-09-09: 400 mg via INTRAVENOUS

## 2023-09-09 MED ORDER — EPHEDRINE SULFATE (PRESSORS) 50 MG/ML IJ SOLN
INTRAMUSCULAR | Status: DC | PRN
  Administered 2023-09-09 (×4): 5 mg via INTRAVENOUS

## 2023-09-09 MED ORDER — PROPOFOL 500 MG/50ML IV EMUL
INTRAVENOUS | Status: DC | PRN
  Administered 2023-09-09: 155 ug/kg/min via INTRAVENOUS

## 2023-09-09 NOTE — Transfer of Care (Signed)
Immediate Anesthesia Transfer of Care Note  Patient: Nicholas Espinoza  Procedure(s) Performed: Procedure(s): UPPER ENDOSCOPIC ULTRASOUND (EUS) RADIAL (N/A) ESOPHAGOGASTRODUODENOSCOPY (EGD) (N/A) BIOPSY  Patient Location: PACU  Anesthesia Type:MAC  Level of Consciousness:  sedated, patient cooperative and responds to stimulation  Airway & Oxygen Therapy:Patient Spontanous Breathing and Patient connected to face mask oxgen  Post-op Assessment:  Report given to PACU RN and Post -op Vital signs reviewed and stable  Post vital signs:  Reviewed and stable  Last Vitals:  Vitals:   09/09/23 0909  BP: 122/84  Pulse: (!) 57  Resp: 12  Temp: 36.4 C  SpO2: 100%    Complications: No apparent anesthesia complications

## 2023-09-09 NOTE — Op Note (Signed)
[facility] Patient Name: Nicholas Espinoza Procedure Date: 09/09/2023 MRN: [id number] Attending MD: Corliss Parish , MD, [id number] Date of Birth: [birth year] CSN: [id number] Age: 63 Admit Type: Outpatient Procedure:                Upper EUS Indications:              Duodenal deformity on endoscopy/Subepithelial tumor                            vs. extrinsic compression Providers:                Corliss Parish, MD, Fransisca Connors, Alan Ripper, Technician Referring MD:              Medicines:                Monitored Anesthesia Care, Cipro 400 mg IV Complications:            No immediate complications. Estimated Blood Loss:     Estimated blood loss was minimal. Procedure:                Pre-Anesthesia Assessment:                           - Prior to the procedure, a History and Physical                            was performed, and patient medications and                            allergies were reviewed. The patient's tolerance of                            previous anesthesia was also reviewed. The risks                            and benefits of the procedure and the sedation                            options and risks were discussed with the patient.                            All questions were answered, and informed consent                            was obtained. Prior Anticoagulants: The patient                            last took Plavix (clopidogrel) 5 days prior to the                            procedure and has taken no anticoagulant or  antiplatelet agents except for aspirin. ASA Grade                            Assessment: III - A patient with severe systemic                            disease. After reviewing the risks and benefits,                            the patient was deemed in satisfactory condition to                            undergo the procedure.                           After  obtaining informed consent, the endoscope was                            passed under direct vision. Throughout the                            procedure, the patient's blood pressure, pulse, and                            oxygen saturations were monitored continuously. The                            [id number]-H190 ([id number]) Olympus endoscope was introduced                            through the mouth, and advanced to the second part                            of duodenum. The [id number]-[id number] ([id number]) Olympus                            duodenoscope was introduced through the mouth, and                            advanced to the area of papilla. The [id number]-AL5                            ([id number]) Olympus radial ultrasound scope was                            introduced through the mouth, and advanced to the                            duodenum for ultrasound examination from the                            stomach and duodenum. The upper EUS was  accomplished without difficulty. The patient                            tolerated the procedure. Scope In: Scope Out: Findings:      ENDOSCOPIC FINDING: :      No gross lesions were noted in the entire esophagus.      The Z-line was irregular and was found 44 cm from the incisors.      Diffuse moderate inflammation characterized by congestion (edema),       erosions, friability and granularity was found in the entire examined       stomach. Biopsies were taken with a cold forceps for histology and       Helicobacter pylori testing.      Diffuse moderately erythematous mucosa was found in the duodenal bulb,       in the first portion of the duodenum and in the second portion of the       duodenum.      A single 22 mm subepithelial nodule versus subepithelial cyst was found       in the duodenal sweep.      Mildly congested major papilla.      ENDOSONOGRAPHIC FINDING: :      A lobulated intramural (subepithelial) lesion was found  in the second       portion of the duodenum. The lesion was anechoic. Endosonographically,       the lesion appeared to originate from within the deep mucosa (Layer 2)       and submucosa (Layer 3). The lesion measured 19 mm x 11 mm in diameter.       The outer margins were smooth.      There was no sign of significant endosonographic abnormality in the       pancreatic head. The pancreatic duct measured up to 2 mm in diameter.       The bile duct coursing through the head measured 4.2 mm. No masses, no       cysts, the pancreatic duct was regular in contour.      No malignant-appearing lymph nodes were visualized in the celiac region       (level 20) and porta hepatis region.      Endosonographic imaging in the visualized portion of the liver showed no       mass.      The celiac region was visualized. Impression:               EGD impression:                           - No gross lesions in the entire esophagus. Z-line                            irregular, 44 cm from the incisors.                           - Gastritis. Biopsied.                           - Erythematous duodenopathy.                           - Subepithelial lesion found in the duodenal  sweep.                           - Congested major papilla.                           EUS impression:                           - An intramural (subepithelial) lesion was found in                            the second portion of the duodenum. The lesion                            appeared to originate from within the deep mucosa                            (Layer 2) and submucosa (Layer 3). Tissue has not                            been obtained. 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                           - There was no sign of significant pathology in the                            pancreatic head (normal  CBD/PD in this area).                           - No malignant-appearing lymph nodes were                            visualized in the celiac region (level 20) and                            porta hepatis region. Moderate Sedation:      Not Applicable - Patient had care per Anesthesia. Recommendation:           - The patient will be observed post-procedure,                            until all discharge criteria are met.                           - Discharge patient to home.                           - Patient has a contact number available for  emergencies. The signs and symptoms of potential                            delayed complications were discussed with the                            patient. Return to normal activities tomorrow.                            Written discharge instructions were provided to the                            patient.                           - Resume previous diet.                           - Observe patient's clinical course.                           - Start PPI 40 mg twice daily.                           - Carafate 1 g twice daily x 2 months.                           - Observe patient's clinical course.                           - Consider repeat upper EUS in 2 years, to ensure                            stability of the cyst.                           - The findings and recommendations were discussed                            with the patient.                           - The findings and recommendations were discussed                            with the designated responsible adult. Procedure Code(s):        --- Professional ---                           [id number], Esophagogastroduodenoscopy, flexible,                            transoral; with endoscopic ultrasound examination                            limited to the esophagus, stomach or duodenum, and  adjacent structures                            43239, Esophagogastroduodenoscopy, flexible,                            transoral; with biopsy, single or multiple Diagnosis Code(s):        --- Professional ---                           K22.89, Other specified disease of esophagus                           K29.70, Gastritis, unspecified, without bleeding                           K31.89, Other diseases of stomach and duodenum                           I89.9, Noninfective disorder of lymphatic vessels                            and lymph nodes, unspecified CPT copyright 2022 American Medical Association. All rights reserved. The codes documented in this report are preliminary and upon coder review may  be revised to meet current compliance requirements. Corliss Parish, MD 09/09/2023 12:25:19 PM Number of Addenda: 0

## 2023-09-09 NOTE — Discharge Instructions (Signed)

## 2023-09-09 NOTE — Anesthesia Postprocedure Evaluation (Signed)
Anesthesia Post Note  Patient: Nicholas Espinoza  Procedure(s) Performed: UPPER ENDOSCOPIC ULTRASOUND (EUS) RADIAL ESOPHAGOGASTRODUODENOSCOPY (EGD) BIOPSY     Patient location during evaluation: Endoscopy Anesthesia Type: MAC Level of consciousness: awake and alert Pain management: pain level controlled Vital Signs Assessment: post-procedure vital signs reviewed and stable Respiratory status: spontaneous breathing, nonlabored ventilation, respiratory function stable and patient connected to nasal cannula oxygen Cardiovascular status: blood pressure returned to baseline and stable Postop Assessment: no apparent nausea or vomiting Anesthetic complications: no   No notable events documented.  Last Vitals:  Vitals:   09/09/23 1130 09/09/23 1140  BP: 106/74 128/85  Pulse: 76 62  Resp: (!) 24 12  Temp:    SpO2: 100% 99%    Last Pain:  Vitals:   09/09/23 1140  TempSrc:   PainSc: 0-No pain                 Trevor Iha

## 2023-09-09 NOTE — H&P (Signed)
GASTROENTEROLOGY PROCEDURE H&P NOTE   Primary Care Physician: Claiborne Rigg, NP  HPI: Nicholas Espinoza is a 63 y.o. male who presents for EGD/EUS to evaluate Duodenal cyst v SEL.    Past Medical History:  Diagnosis Date   Hyperlipidemia    Left ventricular diastolic dysfunction, NYHA class 1    Past Surgical History:  Procedure Laterality Date   ABDOMINAL AORTOGRAM W/LOWER EXTREMITY Bilateral 09/02/2020   Procedure: ABDOMINAL AORTOGRAM W/LOWER EXTREMITY;  Surgeon: Runell Gess, MD;  Location: [facility];  Service: Cardiovascular;  Laterality: Bilateral;   DENTAL SURGERY     PERIPHERAL VASCULAR ATHERECTOMY Left 09/02/2020   Procedure: PERIPHERAL VASCULAR ATHERECTOMY;  Surgeon: Runell Gess, MD;  Location: [facility] INVASIVE CV LAB;  Service: Cardiovascular;  Laterality: Left;   PERIPHERAL VASCULAR INTERVENTION Left 09/02/2020   Procedure: PERIPHERAL VASCULAR INTERVENTION;  Surgeon: Runell Gess, MD;  Location: [facility];  Service: Cardiovascular;  Laterality: Left;   No current facility-administered medications for this encounter.   No current facility-administered medications for this encounter. No Known Allergies Family History  Problem Relation Age of Onset   Heart attack Mother    Cancer - Other Father    Diabetes Sister    Diabetes Sister    Colon cancer Brother    Heart attack Brother 8   Esophageal cancer Neg Hx    Rectal cancer Neg Hx    Stomach cancer Neg Hx    Social History   Socioeconomic History   Marital status: Single    Spouse name: Not on file   Number of children: 4   Years of education: Not on file   Highest education level: Not on file  Occupational History   Occupation: unemployed  Tobacco Use   Smoking status: Some Days    Current packs/day: 0.50    Average packs/day: 0.5 packs/day for 45.5 years (22.6 ttl pk-yrs)    Types: Cigarettes    Start date: 94   Smokeless tobacco: Never   Tobacco comments:    less  then 1 cigarette daily  Vaping Use   Vaping status: Never Used  Substance and Sexual Activity   Alcohol use: Yes    Comment: occ   Drug use: Yes    Types: Marijuana   Sexual activity: Yes  Other Topics Concern   Not on file  Social History Narrative   Not on file   Social Drivers of Health   Financial Resource Strain: Low Risk  (09/03/2023)   Overall Financial Resource Strain (CARDIA)    Difficulty of Paying Living Expenses: Not very hard  Food Insecurity: No Food Insecurity (09/03/2023)   Hunger Vital Sign    Worried About Running Out of Food in the Last Year: Never true    Ran Out of Food in the Last Year: Never true  Transportation Needs: No Transportation Needs (09/03/2023)   PRAPARE - Administrator, Civil Service (Medical): No    Lack of Transportation (Non-Medical): No  Physical Activity: Inactive (09/03/2023)   Exercise Vital Sign    Days of Exercise per Week: 0 days    Minutes of Exercise per Session: 0 min  Stress: Stress Concern Present (09/03/2023)   [employer] of Occupational Health - Occupational Stress Questionnaire    Feeling of Stress : Very much  Social Connections: Socially Isolated (09/03/2023)   Social Connection and Isolation Panel [NHANES]    Frequency of Communication with Friends and Family: Twice a week  Frequency of Social Gatherings with Friends and Family: Never    Attends Religious Services: Never    Database administrator or Organizations: No    Attends Banker Meetings: Never    Marital Status: Never married  Intimate Partner Violence: Not At Risk (09/03/2023)   Humiliation, Afraid, Rape, and Kick questionnaire    Fear of Current or Ex-Partner: No    Emotionally Abused: No    Physically Abused: No    Sexually Abused: No    Physical Exam: Today's Vitals   08/31/23 1130  Weight: 58 kg   Body mass index is 18.88 kg/m. GEN: NAD EYE: Sclerae anicteric ENT: MMM CV: Non-tachycardic GI: Soft, NT/ND NEURO:   Alert & Oriented x 3  Lab Results: No results for input(s): "WBC", "HGB", "HCT", "PLT" in the last 72 hours. BMET No results for input(s): "NA", "K", "CL", "CO2", "GLUCOSE", "BUN", "CREATININE", "CALCIUM" in the last 72 hours. LFT No results for input(s): "PROT", "ALBUMIN", "AST", "ALT", "ALKPHOS", "BILITOT", "BILIDIR", "IBILI" in the last 72 hours. PT/INR No results for input(s): "LABPROT", "INR" in the last 72 hours.   Impression / Plan: This is a 63 y.o.male who presents for EGD/EUS to evaluate Duodenal cyst v SEL.    The risks of an EUS including intestinal perforation, bleeding, infection, aspiration, and medication effects were discussed as was the possibility it may not give a definitive diagnosis if a biopsy is performed.  When a biopsy of the pancreas is done as part of the EUS, there is an additional risk of pancreatitis at the rate of about 1-2%.  It was explained that procedure related pancreatitis is typically mild, although it can be severe and even life threatening, which is why we do not perform random pancreatic biopsies and only biopsy a lesion/area we feel is concerning enough to warrant the risk.   The risks and benefits of endoscopic evaluation/treatment were discussed with the patient and/or family; these include but are not limited to the risk of perforation, infection, bleeding, missed lesions, lack of diagnosis, severe illness requiring hospitalization, as well as anesthesia and sedation related illnesses.  The patient's history has been reviewed, patient examined, no change in status, and deemed stable for procedure.  The patient and/or family is agreeable to proceed.    Nicholas Parish, MD [facility] Office # [phone]

## 2023-09-10 ENCOUNTER — Encounter: Payer: Self-pay | Admitting: Gastroenterology

## 2023-09-10 LAB — SURGICAL PATHOLOGY

## 2023-09-12 ENCOUNTER — Encounter (HOSPITAL_COMMUNITY): Payer: Self-pay | Admitting: Gastroenterology

## 2023-09-13 NOTE — Progress Notes (Signed)
Complex Care Management Note  Care Guide Note 09/13/2023 Name: Nicholas Espinoza MRN: [id number] DOB: May 12, 1961  Nicholas Espinoza is a 63 y.o. year old male who sees Claiborne Rigg, NP for primary care. I reached out to Earnstine Regal by phone today to offer complex care management services.  Mr. Greeney was given information about Complex Care Management services today including:   The Complex Care Management services include support from the care team which includes your Nurse Coordinator, Clinical Social Worker, or Pharmacist.  The Complex Care Management team is here to help remove barriers to the health concerns and goals most important to you. Complex Care Management services are voluntary, and the patient may decline or stop services at any time by request to their care team member.   Complex Care Management Consent Status: Patient agreed to services and verbal consent obtained.   Follow up plan:  Telephone appointment with complex care management team member scheduled for:  09/23/23  Encounter Outcome:  Patient Scheduled  Gwenevere Ghazi  [facility]  [facility], [facility] Guide  Direct Dial: [phone]  Fax [phone]

## 2023-09-13 NOTE — Progress Notes (Signed)
Complex Care Management Note Care Guide Note  09/13/2023 Name: Nicholas Espinoza MRN: [id number] DOB: 29-Jun-1961   Complex Care Management Outreach Attempts: A second unsuccessful outreach was attempted today to offer the patient with information about available complex care management services.  Follow Up Plan:  Additional outreach attempts will be made to offer the patient complex care management information and services.   Encounter Outcome:  No Answer  Gwenevere Ghazi  [facility]  [facility], [facility] Guide  Direct Dial: [phone]  Fax [phone]

## 2023-09-15 ENCOUNTER — Other Ambulatory Visit: Payer: Self-pay

## 2023-09-21 NOTE — Progress Notes (Deleted)
  Cardiology Office Note:  .   Date:  09/21/2023  ID:  Nicholas Espinoza, DOB 24-May-1961, MRN [id number] PCP: Claiborne Rigg, NP  [facility] Providers Cardiologist:  Reatha Harps, MD { Click to update primary MD,subspecialty MD or APP then REFRESH:1}   History of Present Illness: .   Nicholas Espinoza is a 63 y.o. male with history of brugada pattern, PAD, CAD, HLD who presents for follow-up.      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 Tobacco abuse  3. PAD -90% mid left common iliac stenosis -> stenting 09/02/2020 4. Non-obstructive CAD -CAC score 53 (63rd percentile) -<25% LAD 5. HLD -T chol 135, HDL 55, LDL 61, TG 106    ROS: All other ROS reviewed and negative. Pertinent positives noted in the HPI.     Studies Reviewed: Marland Kitchen        CCTA 07/08/2020 IMPRESSION: 1.  Calcium score 53 which is 63 rd percentile for age / sex   2.   Normal aortic root 3.5 cm   3.   CAD RADS 1 non-obstructive CAD in LAD Physical Exam:   VS:  There were no vitals taken for this visit.   Wt Readings from Last 3 Encounters:  08/31/23 127 lb 13.9 oz (58 kg)  09/03/23 126 lb (57.2 kg)  09/01/23 128 lb (58.1 kg)    GEN: Well nourished, well developed in no acute distress NECK: No JVD; No carotid bruits CARDIAC: ***RRR, no murmurs, rubs, gallops RESPIRATORY:  Clear to auscultation without rales, wheezing or rhonchi  ABDOMEN: Soft, non-tender, non-distended EXTREMITIES:  No edema; No deformity  ASSESSMENT AND PLAN: .   ***    {Are you ordering a CV Procedure (e.g. stress test, cath, DCCV, TEE, etc)?   Press F2        :[id number]}   Follow-up: No follow-ups on file.  Time Spent with Patient: I have spent a total of *** minutes caring for this patient today face to face, ordering and reviewing labs/tests, reviewing prior records/medical history, examining the patient, establishing an assessment and plan, communicating results/findings to the  patient/family, and documenting in the medical record.   Signed, Lenna Gilford. Flora Lipps, MD, [facility]  [facility]  [address] [place], [place] [address] [phone]  11:42 AM

## 2023-09-23 ENCOUNTER — Ambulatory Visit: Payer: Self-pay

## 2023-09-23 NOTE — Patient Outreach (Signed)
  Care Coordination   Initial Visit Note   09/23/2023 Name: Nicholas Espinoza MRN: [id number] DOB: 21-Dec-1960  Nicholas Espinoza is a 64 y.o. year old male who sees Claiborne Rigg, NP for primary care. I spoke with  Nicholas Espinoza by phone today.  What matters to the patients health and wellness today?  Patient does not know the reason for the referral and reports no unmet need regarding housing, transportation, food, utilities or finances.  Patient is unable to complete the assessment today due to home repairs but does not request a follow up.  SW messaged provider for clarification.    Goals Addressed   None     SDOH assessments and interventions completed:  No     Care Coordination Interventions:  No, not indicated   Follow up plan: No further intervention required.   Encounter Outcome:  Patient Visit Completed

## 2023-09-24 ENCOUNTER — Ambulatory Visit
Payer: No Typology Code available for payment source | Attending: Cardiovascular Disease | Admitting: Cardiovascular Disease

## 2023-09-24 DIAGNOSIS — I739 Peripheral vascular disease, unspecified: Secondary | ICD-10-CM

## 2023-09-24 DIAGNOSIS — R931 Abnormal findings on diagnostic imaging of heart and coronary circulation: Secondary | ICD-10-CM

## 2023-09-24 DIAGNOSIS — E7800 Pure hypercholesterolemia, unspecified: Secondary | ICD-10-CM

## 2023-09-24 DIAGNOSIS — I498 Other specified cardiac arrhythmias: Secondary | ICD-10-CM

## 2024-01-06 ENCOUNTER — Other Ambulatory Visit: Payer: Self-pay | Admitting: Cardiovascular Disease

## 2024-01-06 ENCOUNTER — Other Ambulatory Visit: Payer: Self-pay

## 2024-01-07 ENCOUNTER — Telehealth: Payer: Self-pay | Admitting: Cardiovascular Disease

## 2024-01-07 ENCOUNTER — Other Ambulatory Visit (HOSPITAL_COMMUNITY): Payer: Self-pay

## 2024-01-07 ENCOUNTER — Other Ambulatory Visit: Payer: Self-pay

## 2024-01-07 DIAGNOSIS — I739 Peripheral vascular disease, unspecified: Secondary | ICD-10-CM

## 2024-01-07 MED ORDER — CLOPIDOGREL BISULFATE 75 MG PO TABS
75.0000 mg | ORAL_TABLET | Freq: Every day | ORAL | 0 refills | Status: DC
  Filled 2024-01-07: qty 90, 90d supply, fill #0

## 2024-01-07 NOTE — Telephone Encounter (Signed)
*  STAT* If patient is at the pharmacy, call can be transferred to refill team.   1. Which medications need to be refilled? (please list name of each medication and dose if known)   clopidogrel  (PLAVIX ) 75 MG tablet   2. Which pharmacy/location (including street and city if local pharmacy) is medication to be sent to?   [facility]    3. Do they need a 30 day or 90 day supply? 90

## 2024-01-07 NOTE — Telephone Encounter (Signed)
 Pt's medication was sent to pt's pharmacy as requested. Confirmation received.

## 2024-03-01 ENCOUNTER — Ambulatory Visit (HOSPITAL_COMMUNITY): Admission: RE | Admit: 2024-03-01 | Payer: BC Managed Care – PPO | Source: Ambulatory Visit

## 2024-03-01 ENCOUNTER — Ambulatory Visit (HOSPITAL_COMMUNITY): Admission: RE | Admit: 2024-03-01 | Source: Ambulatory Visit

## 2024-03-23 ENCOUNTER — Ambulatory Visit (HOSPITAL_COMMUNITY)

## 2024-03-23 ENCOUNTER — Encounter (HOSPITAL_COMMUNITY): Payer: Self-pay

## 2024-05-05 ENCOUNTER — Ambulatory Visit (HOSPITAL_COMMUNITY)
Admission: RE | Admit: 2024-05-05 | Discharge: 2024-05-05 | Disposition: A | Discharge status: Home / Self Care | Source: Ambulatory Visit | Attending: Cardiovascular Disease | Admitting: Cardiovascular Disease

## 2024-05-05 ENCOUNTER — Ambulatory Visit (HOSPITAL_COMMUNITY)

## 2024-05-05 DIAGNOSIS — I739 Peripheral vascular disease, unspecified: Secondary | ICD-10-CM

## 2024-07-24 ENCOUNTER — Other Ambulatory Visit: Payer: Self-pay

## 2024-07-24 ENCOUNTER — Other Ambulatory Visit: Payer: Self-pay | Admitting: Gastroenterology

## 2024-07-24 ENCOUNTER — Other Ambulatory Visit: Payer: Self-pay | Admitting: Cardiovascular Disease

## 2024-07-24 DIAGNOSIS — I739 Peripheral vascular disease, unspecified: Secondary | ICD-10-CM

## 2024-07-24 MED ORDER — SUCRALFATE 1 G PO TABS
1.0000 g | ORAL_TABLET | Freq: Two times a day (BID) | ORAL | 2 refills | Status: AC
  Filled 2024-07-24: qty 60, 30d supply, fill #0

## 2024-07-25 ENCOUNTER — Other Ambulatory Visit: Payer: Self-pay

## 2024-07-25 ENCOUNTER — Other Ambulatory Visit: Payer: Self-pay | Admitting: Cardiovascular Disease

## 2024-07-25 DIAGNOSIS — I739 Peripheral vascular disease, unspecified: Secondary | ICD-10-CM

## 2024-07-25 MED ORDER — CLOPIDOGREL BISULFATE 75 MG PO TABS
75.0000 mg | ORAL_TABLET | Freq: Every day | ORAL | 0 refills | Status: AC
  Filled 2024-07-25: qty 60, 60d supply, fill #0

## 2024-07-26 ENCOUNTER — Other Ambulatory Visit: Payer: Self-pay

## 2024-08-08 ENCOUNTER — Other Ambulatory Visit: Payer: Self-pay

## 2024-08-08 ENCOUNTER — Ambulatory Visit (HOSPITAL_COMMUNITY)
Admission: EM | Admit: 2024-08-08 | Discharge: 2024-08-08 | Disposition: A | Discharge status: Home / Self Care | Source: Home / Self Care

## 2024-08-08 ENCOUNTER — Encounter (HOSPITAL_COMMUNITY): Payer: Self-pay | Admitting: *Deleted

## 2024-08-08 DIAGNOSIS — J069 Acute upper respiratory infection, unspecified: Secondary | ICD-10-CM

## 2024-08-08 LAB — POC COVID19/FLU A&B COMBO
Covid Antigen, POC: NEGATIVE
Influenza A Antigen, POC: NEGATIVE
Influenza B Antigen, POC: NEGATIVE

## 2024-08-08 NOTE — Discharge Instructions (Signed)
-  Your COVID and influenza tests were negative. -You have a virus, like the common cold.  Viruses typically last 5 to 7 days.  After 7 days, your symptoms should be improving rather than worsening.  If your symptoms improve, and then worsen again, this is when we worry about a sinus infection or a lung infection, and you should return for additional care. -For your headache, take tylenol . Look at the directions on your bottle, and you can take the maximum dose, to help with headache, fevers. -Drink plenty of fluids and get plenty of rest!

## 2024-08-08 NOTE — ED Triage Notes (Signed)
 PT reports he has a cough ,HA and sore throat for4 days. Pt wants COVID ,Flu.

## 2024-08-08 NOTE — ED Provider Notes (Signed)
 [facility]    CSN: [id number] Arrival date & time: 08/08/24  1118      History   Chief Complaint Chief Complaint  Patient presents with   Sore Throat   Cough   Headache    HPI Nicholas Espinoza is a 63 y.o. male presenting w viral symptoms. PT reports he has a cough - nonproductive, HA and sore throat for 4 days. Denies n/v/d/c/abd pian. Has attempted tylenol  - last dose was 0300 (10 hours ago). Denies h/o pulm ds.   HPI  Past Medical History:  Diagnosis Date   Hyperlipidemia    Left ventricular diastolic dysfunction, NYHA class 1     Patient Active Problem List   Diagnosis Date Noted   Duodenal nodule 09/09/2023   Gastritis without bleeding 09/09/2023   Thrombocytopenia 03/04/2022   Acute right-sided low back pain with right-sided sciatica 12/06/2020   Peripheral vascular disease, unspecified 07/30/2020   Palpitations 04/04/2020   Abnormal ECG 04/04/2020    Past Surgical History:  Procedure Laterality Date   ABDOMINAL AORTOGRAM W/LOWER EXTREMITY Bilateral 09/02/2020   Procedure: ABDOMINAL AORTOGRAM W/LOWER EXTREMITY;  Surgeon: Court Dorn PARAS, MD;  Location: [facility];  Service: Cardiovascular;  Laterality: Bilateral;   BIOPSY  09/09/2023   Procedure: BIOPSY;  Surgeon: Wilhelmenia Aloha Raddle., MD;  Location: [facility];  Service: Gastroenterology;;   DENTAL SURGERY     ESOPHAGOGASTRODUODENOSCOPY N/A 09/09/2023   Procedure: ESOPHAGOGASTRODUODENOSCOPY (EGD);  Surgeon: Wilhelmenia Aloha Raddle., MD;  Location: [facility];  Service: Gastroenterology;  Laterality: N/A;   EUS N/A 09/09/2023   Procedure: UPPER ENDOSCOPIC ULTRASOUND (EUS) RADIAL;  Surgeon: Wilhelmenia Aloha Raddle., MD;  Location: [facility];  Service: Gastroenterology;  Laterality: N/A;   PERIPHERAL VASCULAR ATHERECTOMY Left 09/02/2020   Procedure: PERIPHERAL VASCULAR ATHERECTOMY;  Surgeon: Court Dorn PARAS, MD;  Location: [facility] INVASIVE CV LAB;  Service: Cardiovascular;  Laterality:  Left;   PERIPHERAL VASCULAR INTERVENTION Left 09/02/2020   Procedure: PERIPHERAL VASCULAR INTERVENTION;  Surgeon: Court Dorn PARAS, MD;  Location: [facility];  Service: Cardiovascular;  Laterality: Left;       Home Medications    Prior to Admission medications  Medication Sig Start Date End Date Taking? Authorizing Provider  aspirin  EC 81 MG tablet Take 81 mg by mouth daily. Swallow whole.   Yes [provider]  clopidogrel  (PLAVIX ) 75 MG tablet Take 1 tablet (75 mg total) by mouth daily.Please call our office to schedule an yearly appointment with Dr. Barbaraann for July 2025 before anymore refills. [phone]. Thank  you 1st attempt 07/25/24  Yes O'Neal, Darryle Ned, MD  pantoprazole  (PROTONIX ) 40 MG tablet Take 1 tablet (40 mg total) by mouth 2 (two) times daily before a meal. 09/09/23 09/08/24 Yes Mansouraty, Aloha Raddle., MD  sucralfate  (CARAFATE ) 1 g tablet Take 1 tablet (1 g total) by mouth 2 (two) times daily. 07/24/24 07/24/25 Yes Mansouraty, Aloha Raddle., MD  albuterol  (PROVENTIL  HFA;VENTOLIN  HFA) 108 (90 Base) MCG/ACT inhaler Inhale 2 puffs into the lungs every 6 (six) hours as needed for wheezing or shortness of breath. 07/11/16 04/08/20  Dasie Faden, MD    Family History Family History  Problem Relation Age of Onset   Heart attack Mother    Cancer - Other Father    Diabetes Sister    Diabetes Sister    Colon cancer Brother    Heart attack Brother 5   Esophageal cancer Neg Hx    Rectal cancer Neg Hx    Stomach cancer  Neg Hx     Social History Social History[1]   Allergies   Patient has no known allergies.   Review of Systems Review of Systems  Constitutional:  Negative for appetite change, chills and fever.  HENT:  Positive for congestion and sore throat. Negative for ear pain, rhinorrhea, sinus pressure and sinus pain.   Eyes:  Negative for redness and visual disturbance.  Respiratory:  Positive for cough. Negative for chest tightness, shortness  of breath and wheezing.   Cardiovascular:  Negative for chest pain and palpitations.  Gastrointestinal:  Negative for abdominal pain, constipation, diarrhea, nausea and vomiting.  Genitourinary:  Negative for dysuria, frequency and urgency.  Musculoskeletal:  Negative for myalgias.  Neurological:  Positive for headaches. Negative for dizziness and weakness.  Psychiatric/Behavioral:  Negative for confusion.   All other systems reviewed and are negative.    Physical Exam Triage Vital Signs ED Triage Vitals  Encounter Vitals Group     BP 08/08/24 1232 119/84     Girls Systolic BP Percentile --      Girls Diastolic BP Percentile --      Boys Systolic BP Percentile --      Boys Diastolic BP Percentile --      Pulse Rate 08/08/24 1232 75     Resp 08/08/24 1232 18     Temp 08/08/24 1232 98.2 F (36.8 C)     Temp src --      SpO2 08/08/24 1232 98 %     Weight --      Height --      Head Circumference --      Peak Flow --      Pain Score 08/08/24 1228 7     Pain Loc --      Pain Education --      Exclude from Growth Chart --    No data found.  Updated Vital Signs BP 119/84   Pulse 75   Temp 98.2 F (36.8 C)   Resp 18   SpO2 98%   Visual Acuity Right Eye Distance:   Left Eye Distance:   Bilateral Distance:    Right Eye Near:   Left Eye Near:    Bilateral Near:     Physical Exam Vitals reviewed.  Constitutional:      General: He is not in acute distress.    Appearance: Normal appearance. He is not ill-appearing.  HENT:     Head: Normocephalic and atraumatic.     Right Ear: Tympanic membrane, ear canal and external ear normal. No tenderness. No middle ear effusion. There is no impacted cerumen. Tympanic membrane is not perforated, erythematous, retracted or bulging.     Left Ear: Tympanic membrane, ear canal and external ear normal. No tenderness.  No middle ear effusion. There is no impacted cerumen. Tympanic membrane is not perforated, erythematous, retracted or  bulging.     Nose: Nose normal. No congestion.     Mouth/Throat:     Mouth: Mucous membranes are moist.     Pharynx: Uvula midline. No oropharyngeal exudate or posterior oropharyngeal erythema.     Tonsils: No tonsillar exudate.  Eyes:     Extraocular Movements: Extraocular movements intact.     Pupils: Pupils are equal, round, and reactive to light.  Cardiovascular:     Rate and Rhythm: Normal rate and regular rhythm.     Heart sounds: Normal heart sounds.  Pulmonary:     Effort: Pulmonary effort is normal.  Breath sounds: Normal breath sounds. No decreased breath sounds, wheezing, rhonchi or rales.  Abdominal:     Palpations: Abdomen is soft.     Tenderness: There is no abdominal tenderness. There is no guarding or rebound.  Lymphadenopathy:     Cervical: No cervical adenopathy.     Right cervical: No superficial, deep or posterior cervical adenopathy.    Left cervical: No superficial, deep or posterior cervical adenopathy.  Skin:    Comments: No rash   Neurological:     General: No focal deficit present.     Mental Status: He is alert and oriented to person, place, and time.  Psychiatric:        Mood and Affect: Mood normal.        Behavior: Behavior normal.        Thought Content: Thought content normal.        Judgment: Judgment normal.      UC Treatments / Results  Labs (all labs ordered are listed, but only abnormal results are displayed) Labs Reviewed  POC COVID19/FLU A&B COMBO - Normal    EKG   Radiology No results found.  Procedures Procedures (including critical care time)  Medications Ordered in UC Medications - No data to display  Initial Impression / Assessment and Plan / UC Course  I have reviewed the triage vital signs and the nursing notes.  Pertinent labs & imaging results that were available during my care of the patient were reviewed by me and considered in my medical decision making (see chart for details).     Patient is a  pleasant 63 y.o. male presenting with viral URI. The patient is afebrile and nontachycardic.  Antipyretic has not been administered today.  Creatinine and GFR WNL 08/2023 CMP. Has been advised to avoid ibuprofen .  -Covid negative -Influenza negative  He will increase his acetaminophen  for his headaches, and he will also increase hydration.  Return precautions as below.   Final Clinical Impressions(s) / UC Diagnoses   Final diagnoses:  Viral URI     Discharge Instructions      -Your COVID and influenza tests were negative. -You have a virus, like the common cold.  Viruses typically last 5 to 7 days.  After 7 days, your symptoms should be improving rather than worsening.  If your symptoms improve, and then worsen again, this is when we worry about a sinus infection or a lung infection, and you should return for additional care. -For your headache, take tylenol . Look at the directions on your bottle, and you can take the maximum dose, to help with headache, fevers. -Drink plenty of fluids and get plenty of rest!     ED Prescriptions   None    PDMP not reviewed this encounter.     [1]  Social History Tobacco Use   Smoking status: Some Days    Current packs/day: 0.50    Average packs/day: 0.5 packs/day for 46.5 years (23.1 ttl pk-yrs)    Types: Cigarettes    Start date: 44   Smokeless tobacco: Never   Tobacco comments:    less then 1 cigarette daily  Vaping Use   Vaping status: Never Used  Substance Use Topics   Alcohol use: Yes    Comment: occ   Drug use: Yes    Types: Marijuana     Arlyss Leita BRAVO, PA-C 08/08/24 1329

## 2024-08-28 ENCOUNTER — Ambulatory Visit (INDEPENDENT_AMBULATORY_CARE_PROVIDER_SITE_OTHER)

## 2024-08-28 ENCOUNTER — Ambulatory Visit (HOSPITAL_COMMUNITY)
Admission: EM | Admit: 2024-08-28 | Discharge: 2024-08-28 | Disposition: A | Discharge status: Home / Self Care | Attending: Physician Assistant | Admitting: Physician Assistant

## 2024-08-28 ENCOUNTER — Encounter (HOSPITAL_COMMUNITY): Payer: Self-pay | Admitting: Emergency Medicine

## 2024-08-28 ENCOUNTER — Ambulatory Visit (HOSPITAL_COMMUNITY): Payer: Self-pay | Admitting: Physician Assistant

## 2024-08-28 ENCOUNTER — Other Ambulatory Visit: Payer: Self-pay

## 2024-08-28 DIAGNOSIS — J40 Bronchitis, not specified as acute or chronic: Secondary | ICD-10-CM

## 2024-08-28 DIAGNOSIS — R052 Subacute cough: Secondary | ICD-10-CM

## 2024-08-28 DIAGNOSIS — J329 Chronic sinusitis, unspecified: Secondary | ICD-10-CM

## 2024-08-28 MED ORDER — ALBUTEROL SULFATE HFA 108 (90 BASE) MCG/ACT IN AERS
INHALATION_SPRAY | RESPIRATORY_TRACT | Status: AC
  Filled 2024-08-28: qty 6.7

## 2024-08-28 MED ORDER — AMOXICILLIN-POT CLAVULANATE 875-125 MG PO TABS
1.0000 | ORAL_TABLET | Freq: Two times a day (BID) | ORAL | 0 refills | Status: AC
  Filled 2024-08-28: qty 14, 7d supply, fill #0

## 2024-08-28 MED ORDER — ALBUTEROL SULFATE HFA 108 (90 BASE) MCG/ACT IN AERS
2.0000 | INHALATION_SPRAY | Freq: Once | RESPIRATORY_TRACT | Status: AC
  Administered 2024-08-28: 2 via RESPIRATORY_TRACT

## 2024-08-28 MED ORDER — PROMETHAZINE-DM 6.25-15 MG/5ML PO SYRP
5.0000 mL | ORAL_SOLUTION | Freq: Two times a day (BID) | ORAL | 0 refills | Status: AC | PRN
  Filled 2024-08-28: qty 118, 12d supply, fill #0

## 2024-08-28 MED ORDER — PREDNISONE 20 MG PO TABS
20.0000 mg | ORAL_TABLET | Freq: Every day | ORAL | 0 refills | Status: AC
  Filled 2024-08-28: qty 4, 4d supply, fill #0

## 2024-08-28 NOTE — Discharge Instructions (Addendum)
 I did not see pneumonia on your x-ray.  I will contact you if the radiologist sees something that I did not see.  We are going to start an antibiotic to cover for sinus/bronchitis infection.  Take Augmentin  twice daily.  Start prednisone  20 mg for 4 days.  Do not take NSAIDs with this medication including aspirin  (other than your baby aspirin ), ibuprofen /Advil /Motrin , naproxen /Aleve .  You can use acetaminophen /Tylenol .  Take Promethazine  DM up to twice a day for cough.  This can make you sleepy so do not drive or drink alcohol taking this medication.  If you are not feeling better within a week please return for reevaluation.  If anything worsens and you have high fever, worsening cough, shortness of breath, chest pain, nausea/vomiting interfering with oral intake you need to be seen immediately.

## 2024-08-28 NOTE — ED Provider Notes (Signed)
 " [facility]    CSN: [id number] Arrival date & time: 08/28/24  1126      History   Chief Complaint Chief Complaint  Patient presents with   Cough    HPI Nicholas Espinoza is a 64 y.o. male.   Patient presents today with a several week history of ongoing cough.  He was seen by our clinic on 08/08/2024 at which point he tested negative for influenza and COVID.  Since then he had slight improvement of his cough only to have it worsened again within a few days.  Denies any fever, chest pain, nausea, vomiting, diarrhea.  He does have occasional shortness of breath particular with coughing.  He does occasionally smoke cigarettes but denies any history of chronic lung condition including asthma, COPD.  He denies any recent antibiotics or steroids.  He does have albuterol  listed on his chart but does not remember using this medication anytime recently does not have it available.  He has been taking Tylenol  intermittently but has not tried additional over-the-counter medications.    Past Medical History:  Diagnosis Date   Hyperlipidemia    Left ventricular diastolic dysfunction, NYHA class 1     Patient Active Problem List   Diagnosis Date Noted   Duodenal nodule 09/09/2023   Gastritis without bleeding 09/09/2023   Thrombocytopenia 03/04/2022   Acute right-sided low back pain with right-sided sciatica 12/06/2020   Peripheral vascular disease, unspecified 07/30/2020   Palpitations 04/04/2020   Abnormal ECG 04/04/2020    Past Surgical History:  Procedure Laterality Date   ABDOMINAL AORTOGRAM W/LOWER EXTREMITY Bilateral 09/02/2020   Procedure: ABDOMINAL AORTOGRAM W/LOWER EXTREMITY;  Surgeon: Court Dorn PARAS, MD;  Location: [facility];  Service: Cardiovascular;  Laterality: Bilateral;   BIOPSY  09/09/2023   Procedure: BIOPSY;  Surgeon: Wilhelmenia Aloha Raddle., MD;  Location: [facility];  Service: Gastroenterology;;   DENTAL SURGERY     ESOPHAGOGASTRODUODENOSCOPY N/A  09/09/2023   Procedure: ESOPHAGOGASTRODUODENOSCOPY (EGD);  Surgeon: Wilhelmenia Aloha Raddle., MD;  Location: [facility];  Service: Gastroenterology;  Laterality: N/A;   EUS N/A 09/09/2023   Procedure: UPPER ENDOSCOPIC ULTRASOUND (EUS) RADIAL;  Surgeon: Wilhelmenia Aloha Raddle., MD;  Location: [facility];  Service: Gastroenterology;  Laterality: N/A;   PERIPHERAL VASCULAR ATHERECTOMY Left 09/02/2020   Procedure: PERIPHERAL VASCULAR ATHERECTOMY;  Surgeon: Court Dorn PARAS, MD;  Location: [facility];  Service: Cardiovascular;  Laterality: Left;   PERIPHERAL VASCULAR INTERVENTION Left 09/02/2020   Procedure: PERIPHERAL VASCULAR INTERVENTION;  Surgeon: Court Dorn PARAS, MD;  Location: [facility];  Service: Cardiovascular;  Laterality: Left;       Home Medications    Prior to Admission medications  Medication Sig Start Date End Date Taking? Authorizing Provider  amoxicillin -clavulanate (AUGMENTIN ) 875-125 MG tablet Take 1 tablet by mouth every 12 (twelve) hours. 08/28/24  Yes Rosalene Wardrop K, PA-C  predniSONE  (DELTASONE ) 20 MG tablet Take 1 tablet (20 mg total) by mouth daily for 4 days. 08/28/24 09/01/24 Yes Anetta Olvera K, PA-C  promethazine -dextromethorphan (PROMETHAZINE -DM) 6.25-15 MG/5ML syrup Take 5 mLs by mouth 2 (two) times daily as needed for cough. 08/28/24  Yes Takeysha Bonk K, PA-C  aspirin  EC 81 MG tablet Take 81 mg by mouth daily. Swallow whole.    [provider]  clopidogrel  (PLAVIX ) 75 MG tablet Take 1 tablet (75 mg total) by mouth daily.Please call our office to schedule an yearly appointment with Dr. Barbaraann for July 2025 before anymore refills. [phone]. Thank  you 1st attempt 07/25/24   Barbaraann Darryle Ned, MD  pantoprazole  (PROTONIX ) 40 MG tablet Take 1 tablet (40 mg total) by mouth 2 (two) times daily before a meal. 09/09/23 09/08/24  Mansouraty, Aloha Raddle., MD  sucralfate  (CARAFATE ) 1 g tablet Take 1 tablet (1 g total) by mouth 2 (two) times daily. 07/24/24  07/24/25  Mansouraty, Aloha Raddle., MD  albuterol  (PROVENTIL  HFA;VENTOLIN  HFA) 108 (90 Base) MCG/ACT inhaler Inhale 2 puffs into the lungs every 6 (six) hours as needed for wheezing or shortness of breath. 07/11/16 04/08/20  Dasie Faden, MD    Family History Family History  Problem Relation Age of Onset   Heart attack Mother    Cancer - Other Father    Diabetes Sister    Diabetes Sister    Colon cancer Brother    Heart attack Brother 32   Esophageal cancer Neg Hx    Rectal cancer Neg Hx    Stomach cancer Neg Hx     Social History Social History[1]   Allergies   Patient has no known allergies.   Review of Systems Review of Systems  Constitutional:  Positive for activity change. Negative for appetite change, fatigue and fever.  HENT:  Positive for congestion. Negative for sinus pressure, sneezing and sore throat.   Respiratory:  Positive for cough and shortness of breath.   Cardiovascular:  Negative for chest pain.  Gastrointestinal:  Negative for abdominal pain, diarrhea, nausea and vomiting.  Neurological:  Negative for dizziness, light-headedness and headaches.     Physical Exam Triage Vital Signs ED Triage Vitals  Encounter Vitals Group     BP 08/28/24 1327 117/79     Girls Systolic BP Percentile --      Girls Diastolic BP Percentile --      Boys Systolic BP Percentile --      Boys Diastolic BP Percentile --      Pulse Rate 08/28/24 1327 81     Resp 08/28/24 1327 16     Temp 08/28/24 1327 98.5 F (36.9 C)     Temp Source 08/28/24 1327 Oral     SpO2 08/28/24 1327 98 %     Weight --      Height --      Head Circumference --      Peak Flow --      Pain Score 08/28/24 1325 7     Pain Loc --      Pain Education --      Exclude from Growth Chart --    No data found.  Updated Vital Signs BP 117/79 (BP Location: Left Arm)   Pulse 81   Temp 98.5 F (36.9 C) (Oral)   Resp 16   SpO2 98%   Visual Acuity Right Eye Distance:   Left Eye Distance:    Bilateral Distance:    Right Eye Near:   Left Eye Near:    Bilateral Near:     Physical Exam Vitals reviewed.  Constitutional:      General: He is awake.     Appearance: Normal appearance. He is well-developed. He is not ill-appearing.     Comments: Very pleasant male appears stated age in no acute distress sitting comfortably in exam room  HENT:     Head: Normocephalic and atraumatic.     Right Ear: Tympanic membrane, ear canal and external ear normal. Tympanic membrane is not erythematous or bulging.     Left Ear: Tympanic membrane, ear canal and external ear normal.  Tympanic membrane is not erythematous or bulging.     Nose: Nose normal.     Mouth/Throat:     Pharynx: Uvula midline. No oropharyngeal exudate or posterior oropharyngeal erythema.  Cardiovascular:     Rate and Rhythm: Normal rate and regular rhythm.     Heart sounds: Normal heart sounds, S1 normal and S2 normal. No murmur heard. Pulmonary:     Effort: Pulmonary effort is normal. No accessory muscle usage or respiratory distress.     Breath sounds: No stridor. Examination of the right-lower field reveals decreased breath sounds. Examination of the left-lower field reveals decreased breath sounds. Decreased breath sounds present. No wheezing, rhonchi or rales.  Neurological:     Mental Status: He is alert.  Psychiatric:        Behavior: Behavior is cooperative.      UC Treatments / Results  Labs (all labs ordered are listed, but only abnormal results are displayed) Labs Reviewed - No data to display  EKG   Radiology DG Chest 2 View Result Date: 08/28/2024 CLINICAL DATA:  Cough. EXAM: DG CHEST 2V COMPARISON:  07/14/2023. FINDINGS: Trachea is midline. Heart size normal. Lungs are hyperinflated but clear. No pleural fluid. IMPRESSION: Hyperinflation without acute finding. Electronically Signed   By: Newell Eke M.D.   On: 08/28/2024 14:38    Procedures Procedures (including critical care  time)  Medications Ordered in UC Medications  albuterol  (VENTOLIN  HFA) 108 (90 Base) MCG/ACT inhaler 2 puff (2 puffs Inhalation Given 08/28/24 1420)    Initial Impression / Assessment and Plan / UC Course  I have reviewed the triage vital signs and the nursing notes.  Pertinent labs & imaging results that were available during my care of the patient were reviewed by me and considered in my medical decision making (see chart for details).     Patient is well-appearing, afebrile, nontoxic, nontachycardic.  He had decreased aeration of bilateral bases and so was given albuterol  with improvement of symptoms.  Chest x-ray was obtained that showed no acute cardiopulmonary disease based on my primary read and we were waiting for radiologist overread at the time of discharge we will contact him if this differs and changes our treatment plan.  Given prolonged and worsening symptoms will cover for sinobronchitis with Augmentin .  He was also given prednisone  burst of 20 mg for 4 days and we discussed that he should not take NSAIDs with this medication due to risk of GI bleeding.  He was given Promethazine  DM for cough and we discussed that this can be sedating and he is not to drive or drink alcohol while taking it.  No indication for dose adjustment based on metabolic panel from 09/03/2023 with a creatinine of 0.92 and calculated creatinine clearance of 67 mL/min.  We discussed that to be sent for the better within a week he is to return for reevaluation.  Strict return precautions given.  Excuse note provided.  All questions answered to patient satisfaction.  Final Clinical Impressions(s) / UC Diagnoses   Final diagnoses:  Subacute cough  Sinobronchitis     Discharge Instructions      I did not see pneumonia on your x-ray.  I will contact you if the radiologist sees something that I did not see.  We are going to start an antibiotic to cover for sinus/bronchitis infection.  Take Augmentin  twice daily.   Start prednisone  20 mg for 4 days.  Do not take NSAIDs with this medication including aspirin  (other than your  baby aspirin ), ibuprofen /Advil /Motrin , naproxen /Aleve .  You can use acetaminophen /Tylenol .  Take Promethazine  DM up to twice a day for cough.  This can make you sleepy so do not drive or drink alcohol taking this medication.  If you are not feeling better within a week please return for reevaluation.  If anything worsens and you have high fever, worsening cough, shortness of breath, chest pain, nausea/vomiting interfering with oral intake you need to be seen immediately.     ED Prescriptions     Medication Sig Dispense Auth. Provider   amoxicillin -clavulanate (AUGMENTIN ) 875-125 MG tablet Take 1 tablet by mouth every 12 (twelve) hours. 14 tablet Besnik Febus K, PA-C   promethazine -dextromethorphan (PROMETHAZINE -DM) 6.25-15 MG/5ML syrup Take 5 mLs by mouth 2 (two) times daily as needed for cough. 118 mL Theda Payer K, PA-C   predniSONE  (DELTASONE ) 20 MG tablet Take 1 tablet (20 mg total) by mouth daily for 4 days. 4 tablet Adir Schicker K, PA-C      PDMP not reviewed this encounter.     [1]  Social History Tobacco Use   Smoking status: Some Days    Current packs/day: 0.50    Average packs/day: 0.5 packs/day for 46.5 years (23.1 ttl pk-yrs)    Types: Cigarettes    Start date: 75   Smokeless tobacco: Never   Tobacco comments:    less then 1 cigarette daily  Vaping Use   Vaping status: Never Used  Substance Use Topics   Alcohol use: Yes    Comment: occ   Drug use: Yes    Types: Marijuana     Sherrell Rocky POUR, PA-C 08/28/24 1441  "

## 2024-08-28 NOTE — ED Triage Notes (Addendum)
 Reports a cough for 3 weeks.  Cough is worse at night when trying to sleep.  Patient reports runny nose and sometimes having diarrhea - non today.  Patient was seen on 08/08/2024 for the same symptoms.  There was a brief timeframe where patient thought he was improving, but symptoms returned  Patient has taken tylenol 

## 2024-08-30 ENCOUNTER — Other Ambulatory Visit: Payer: Self-pay
